# Patient Record
Sex: Female | Born: 1995 | Hispanic: Yes | Marital: Single | Smoking: Never smoker
Health system: Southern US, Community
[De-identification: ages and names within clinical notes are randomized; demographics above are authoritative.]

## PROBLEM LIST (undated history)

## (undated) DIAGNOSIS — Z8619 Personal history of other infectious and parasitic diseases: Secondary | ICD-10-CM

## (undated) HISTORY — DX: Personal history of other infectious and parasitic diseases: Z86.19

---
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
## 2016-06-01 ENCOUNTER — Ambulatory Visit (INDEPENDENT_AMBULATORY_CARE_PROVIDER_SITE_OTHER): Payer: Self-pay | Admitting: Family

## 2016-06-01 ENCOUNTER — Encounter: Payer: Self-pay | Admitting: Family

## 2016-06-01 VITALS — BP 106/66 | HR 95 | Temp 98.1°F | Resp 16 | Ht 61.0 in | Wt 85.8 lb

## 2016-06-01 DIAGNOSIS — R110 Nausea: Secondary | ICD-10-CM

## 2016-06-01 LAB — CBC WITH DIFFERENTIAL/PLATELET
BASOS ABS: 0 10*3/uL (ref 0.0–0.1)
BASOS PCT: 0.5 % (ref 0.0–3.0)
EOS ABS: 0 10*3/uL (ref 0.0–0.7)
Eosinophils Relative: 0.8 % (ref 0.0–5.0)
HCT: 40.6 % (ref 36.0–46.0)
HEMOGLOBIN: 13.7 g/dL (ref 12.0–15.0)
Lymphocytes Relative: 34.6 % (ref 12.0–46.0)
Lymphs Abs: 1.7 10*3/uL (ref 0.7–4.0)
MCHC: 33.8 g/dL (ref 30.0–36.0)
MCV: 86.1 fl (ref 78.0–100.0)
MONO ABS: 0.3 10*3/uL (ref 0.1–1.0)
Monocytes Relative: 6.8 % (ref 3.0–12.0)
NEUTROS PCT: 57.3 % (ref 43.0–77.0)
Neutro Abs: 2.9 10*3/uL (ref 1.4–7.7)
Platelets: 208 10*3/uL (ref 150.0–400.0)
RBC: 4.71 Mil/uL (ref 3.87–5.11)
RDW: 13.3 % (ref 11.5–14.6)
WBC: 5 10*3/uL (ref 4.5–10.5)

## 2016-06-01 LAB — COMPREHENSIVE METABOLIC PANEL
ALBUMIN: 4.6 g/dL (ref 3.5–5.2)
ALT: 9 U/L (ref 0–35)
AST: 14 U/L (ref 0–37)
Alkaline Phosphatase: 42 U/L (ref 39–117)
BILIRUBIN TOTAL: 0.3 mg/dL (ref 0.2–1.2)
BUN: 12 mg/dL (ref 6–23)
CHLORIDE: 105 meq/L (ref 96–112)
CO2: 27 mEq/L (ref 19–32)
CREATININE: 0.54 mg/dL (ref 0.40–1.20)
Calcium: 9.6 mg/dL (ref 8.4–10.5)
GFR: 152.71 mL/min (ref >60.00)
Glucose, Bld: 90 mg/dL (ref 70–99)
Potassium: 3.7 mEq/L (ref 3.5–5.1)
SODIUM: 138 meq/L (ref 135–145)
TOTAL PROTEIN: 7.1 g/dL (ref 6.0–8.3)

## 2016-06-01 LAB — TSH: TSH: 1.45 u[IU]/mL (ref 0.35–5.50)

## 2016-06-01 LAB — PREGNANCY, URINE: PREG TEST UR: NEGATIVE

## 2016-06-01 MED ORDER — OMEPRAZOLE 40 MG PO CPDR: 40.0000 mg | DELAYED_RELEASE_CAPSULE | Freq: Every day | ORAL | 3 refills | Status: DC

## 2016-06-01 MED ORDER — ONDANSETRON 4 MG PO TBDP: 4.0000 mg | ORAL_TABLET | Freq: Three times a day (TID) | ORAL | 0 refills | Status: DC | PRN

## 2016-06-01 MED FILL — OMEPRAZOLE DR 40 MG CAPSULE: 40 | 30 days supply | Qty: 30 | Fill #0

## 2016-06-01 MED FILL — ONDANSETRON ODT 4 MG TABLET: 4 | 10 days supply | Qty: 30 | Fill #0

## 2016-06-01 NOTE — Progress Notes (Signed)
Pre visit review using our clinic review tool, if applicable. No additional management support is needed unless otherwise documented below in the visit note. 

## 2016-06-01 NOTE — Addendum Note (Signed)
Addended by: Eustace QuailEABOLD, Cystal Shannahan J on: 06/01/2016 11:59 AM   Modules accepted: Orders

## 2016-06-01 NOTE — Addendum Note (Signed)
Addended by: Eustace QuailEABOLD, Prakash Kimberling J on: 06/01/2016 10:52 AM   Modules accepted: Orders

## 2016-06-01 NOTE — Progress Notes (Signed)
Subjective:    Patient ID: Loretta Gomez, female    DOB: [birth year], 21 y.o.   MRN: [id number]  HPI   Ms. Gomez is a 21 yr old female who presents today to establish care. Pt presents with chief complaint of nausea na weight loss  Reports that this started mild- summer 2017, symptoms worsened after she had nexplanon placed 10/17 by the health department. They also prescribed her megace and reglan which she is not currently using. Reglan did not help her nausea.  LMP spotting one day in march.  She reports tolerating liquids, but even ensure will sometimes even cause gagging.  She is able to tolerate solids. Yesterday she ate: Donuts, fish, pasta and a sandwich yesterday.  Reports that her weight 1 year ago was 92 pounds, 100 pounds is her baseline weight.   She denies associated abdominal pain.  Began Nexplanon 12/16/15.  This was prescribed by the health department.     Review of Systems  Constitutional: Negative for unexpected weight change.  HENT: Negative for hearing loss and rhinorrhea.   Eyes: Negative for visual disturbance.  Respiratory: Negative for cough.   Gastrointestinal: Negative for blood in stool, constipation and diarrhea.  Genitourinary: Negative for dysuria and frequency.  Musculoskeletal: Negative for arthralgias and myalgias.  Neurological:       Occasional stress headaches  Hematological: Negative for adenopathy.  Psychiatric/Behavioral:       Does have some anxiety symptoms.  This occurs when she is around people/crowds Denies depression.     History reviewed. No pertinent past medical history.   Social History   Social History  . Marital status: Single    Spouse name: N/A  . Number of children: N/A  . Years of education: N/A   Occupational History  . Not on file.   Social History Main Topics  . Smoking status: Current Every Day Smoker    Types: E-cigarettes  . Smokeless tobacco: Never Used  . Alcohol use Yes  . Drug use: Yes     Types: Marijuana  . Sexual activity: Not Currently    Birth control/ protection: Implant   Other Topics Concern  . Not on file   Social History Narrative  . No narrative on file    Past Surgical History:  Procedure Laterality Date  . HERNIA REPAIR N/A 2010    Family History  Problem Relation Age of Onset  . Mental illness Maternal Grandmother   . Cancer Maternal Grandfather   . Heart disease Maternal Grandfather     Allergies not on file  No current outpatient prescriptions on file prior to visit.   No current facility-administered medications on file prior to visit.     BP 106/66 (BP Location: Right Arm, Patient Position: Sitting, Cuff Size: Normal)   Pulse 95   Temp 98.1 F (36.7 C) (Oral)   Resp 16   Ht 5\' 1"  (1.549 m)   Wt 85 lb 12.8 oz (38.9 kg)   SpO2 100%   BMI 16.21 kg/m       Objective:   Physical Exam  Constitutional: She is oriented to person, place, and time. No distress.  cachectic  HENT:  Head: Normocephalic and atraumatic.  Right Ear: Tympanic membrane and ear canal normal.  Left Ear: Tympanic membrane and ear canal normal.  Mouth/Throat: No oropharyngeal exudate, posterior oropharyngeal edema or posterior oropharyngeal erythema.  Neck: Neck supple. No thyromegaly present.  Cardiovascular: Normal rate and regular rhythm.   No murmur heard. Pulmonary/Chest: Effort  normal and breath sounds normal. No respiratory distress. She has no wheezes. She has no rales. She exhibits no tenderness.  Abdominal: Soft. Bowel sounds are normal. She exhibits no distension. There is no tenderness. There is no rebound and no guarding.  Musculoskeletal: She exhibits no edema.  Lymphadenopathy:    She has no cervical adenopathy.  Neurological: She is alert and oriented to person, place, and time.  Skin: Skin is warm and dry.  Psychiatric: She has a normal mood and affect. Her behavior is normal. Judgment and thought content normal.          Assessment  & Plan:  Chronic nausea/weight loss- etiology includes side effect from nexplanon, gastritis/ulcer, H pylori, abnormal thyroid funciton Advised pt as follows:  Please begin omeprazole (antacid medicine daily). Begin zofran as needed for nausea. Complete lab work prior to leaving. You will be contacted about scheduling your abdominal ultrasound to evaluate your gallbladder. Add ensure 1 can three times daily after meals. Stop megace and reglan. Go to ER if you are unable to keep down food/liquids.

## 2016-06-01 NOTE — Patient Instructions (Addendum)
Please begin omeprazole (antacid medicine daily). Begin zofran as needed for nausea. Complete lab work prior to leaving. You will be contacted about scheduling your abdominal ultrasound to evaluate your gallbladder. Add ensure 1 can three times daily after meals. Stop megace and reglan. Go to ER if you are unable to keep down food/liquids.

## 2016-06-02 LAB — H. PYLORI BREATH TEST: H. pylori Breath Test: NOT DETECTED

## 2016-06-07 ENCOUNTER — Telehealth: Payer: Self-pay

## 2016-06-07 NOTE — Telephone Encounter (Signed)
Tried to reach pt left a message for pt to call back.

## 2016-06-07 NOTE — Telephone Encounter (Signed)
-----   Message from Sandford Craze, NP sent at 06/04/2016  4:25 PM EDT ----- Blood work is normal. Negative h pylori, blood count, liver function, kidney function, pregnancy. Will see what we learn from the ultrasound.

## 2016-06-09 ENCOUNTER — Ambulatory Visit (HOSPITAL_BASED_OUTPATIENT_CLINIC_OR_DEPARTMENT_OTHER): Payer: Self-pay

## 2018-04-27 ENCOUNTER — Other Ambulatory Visit: Payer: Self-pay

## 2018-04-27 ENCOUNTER — Emergency Department (HOSPITAL_BASED_OUTPATIENT_CLINIC_OR_DEPARTMENT_OTHER)
Admission: EM | Admit: 2018-04-27 | Discharge: 2018-04-27 | Disposition: A | Discharge status: Home / Self Care | Payer: Self-pay | Attending: Emergency Medicine | Admitting: Emergency Medicine

## 2018-04-27 ENCOUNTER — Emergency Department (HOSPITAL_BASED_OUTPATIENT_CLINIC_OR_DEPARTMENT_OTHER): Payer: Self-pay

## 2018-04-27 ENCOUNTER — Encounter (HOSPITAL_BASED_OUTPATIENT_CLINIC_OR_DEPARTMENT_OTHER): Payer: Self-pay | Admitting: Emergency Medicine

## 2018-04-27 ENCOUNTER — Encounter: Payer: Self-pay | Admitting: Family

## 2018-04-27 DIAGNOSIS — F17290 Nicotine dependence, other tobacco product, uncomplicated: Secondary | ICD-10-CM | POA: Insufficient documentation

## 2018-04-27 DIAGNOSIS — K529 Noninfective gastroenteritis and colitis, unspecified: Secondary | ICD-10-CM | POA: Insufficient documentation

## 2018-04-27 DIAGNOSIS — R197 Diarrhea, unspecified: Secondary | ICD-10-CM

## 2018-04-27 LAB — COMPREHENSIVE METABOLIC PANEL
ALBUMIN: 4.9 g/dL (ref 3.5–5.0)
ALK PHOS: 39 U/L (ref 38–126)
ALT: 12 U/L (ref 0–44)
AST: 20 U/L (ref 15–41)
Anion gap: 9 (ref 5–15)
BUN: 12 mg/dL (ref 6–20)
CO2: 21 mmol/L — AB (ref 22–32)
CREATININE: 0.48 mg/dL (ref 0.44–1.00)
Calcium: 9.4 mg/dL (ref 8.9–10.3)
Chloride: 107 mmol/L (ref 98–111)
GFR calc Af Amer: >60 mL/min (ref >60)
GFR calc non Af Amer: >60 mL/min (ref >60)
GLUCOSE: 100 mg/dL — AB (ref 70–99)
Potassium: 3.4 mmol/L — ABNORMAL LOW (ref 3.5–5.1)
SODIUM: 137 mmol/L (ref 135–145)
Total Bilirubin: 0.6 mg/dL (ref 0.3–1.2)
Total Protein: 7.8 g/dL (ref 6.5–8.1)

## 2018-04-27 LAB — CBC WITH DIFFERENTIAL/PLATELET
Abs Immature Granulocytes: 0.02 10*3/uL (ref 0.00–0.07)
Basophils Absolute: 0 10*3/uL (ref 0.0–0.1)
Basophils Relative: 0 %
EOS PCT: 0 %
Eosinophils Absolute: 0 10*3/uL (ref 0.0–0.5)
HEMATOCRIT: 40.7 % (ref 36.0–46.0)
HEMOGLOBIN: 13.1 g/dL (ref 12.0–15.0)
IMMATURE GRANULOCYTES: 0 %
LYMPHS ABS: 1.7 10*3/uL (ref 0.7–4.0)
LYMPHS PCT: 15 %
MCH: 28.1 pg (ref 26.0–34.0)
MCHC: 32.2 g/dL (ref 30.0–36.0)
MCV: 87.3 fL (ref 80.0–100.0)
MONOS PCT: 5 %
Monocytes Absolute: 0.6 10*3/uL (ref 0.1–1.0)
Neutro Abs: 9.6 10*3/uL — ABNORMAL HIGH (ref 1.7–7.7)
Neutrophils Relative %: 80 %
Platelets: 212 10*3/uL (ref 150–400)
RBC: 4.66 MIL/uL (ref 3.87–5.11)
RDW: 12.2 % (ref 11.5–15.5)
WBC: 11.9 10*3/uL — ABNORMAL HIGH (ref 4.0–10.5)
nRBC: 0 % (ref 0.0–0.2)

## 2018-04-27 LAB — URINALYSIS, ROUTINE W REFLEX MICROSCOPIC
BILIRUBIN URINE: NEGATIVE
Glucose, UA: NEGATIVE mg/dL
Ketones, ur: 15 mg/dL — AB
Leukocytes,Ua: NEGATIVE
NITRITE: NEGATIVE
PH: 6 (ref 5.0–8.0)
Protein, ur: NEGATIVE mg/dL

## 2018-04-27 LAB — PREGNANCY, URINE: Preg Test, Ur: NEGATIVE

## 2018-04-27 LAB — URINALYSIS, MICROSCOPIC (REFLEX)

## 2018-04-27 LAB — OCCULT BLOOD X 1 CARD TO LAB, STOOL: Fecal Occult Bld: POSITIVE — AB

## 2018-04-27 LAB — LIPASE, BLOOD: Lipase: 34 U/L (ref 11–51)

## 2018-04-27 MED ORDER — CIPROFLOXACIN HCL 500 MG PO TABS
500.0000 mg | ORAL_TABLET | Freq: Two times a day (BID) | ORAL | 0 refills | Status: AC
Start: 2018-04-27 — End: 2018-05-04

## 2018-04-27 MED ORDER — SODIUM CHLORIDE 0.9 % IV BOLUS
500.0000 mL | Freq: Once | INTRAVENOUS | Status: AC
  Administered 2018-04-27: 19:00:00 via INTRAVENOUS

## 2018-04-27 MED ORDER — METRONIDAZOLE 500 MG PO TABS: 500.0000 mg | ORAL_TABLET | Freq: Two times a day (BID) | ORAL | 0 refills | Status: AC

## 2018-04-27 MED ORDER — IOPAMIDOL (ISOVUE-300) INJECTION 61%
100.0000 mL | Freq: Once | INTRAVENOUS | Status: AC | PRN
  Administered 2018-04-27: 100 mL via INTRAVENOUS

## 2018-04-27 NOTE — Discharge Instructions (Signed)
You were seen in the ED today with bloody diarrhea. We see inflammation in the colon on CT. This may respond to antibiotics but you will need to call the Gastroenterologist tomorrow and schedule a follow up appointment in the next 2 weeks. Return to the ED with any fever, chills, or other concerning symptoms. If you begin to have pain in your joints, arms/legs, or rash you should stop the antibiotics and seek medical attention. Do not drink alcohol while taking this medication.

## 2018-04-27 NOTE — ED Provider Notes (Signed)
Emergency Department Provider Note   I have reviewed the triage vital signs and the nursing notes.   HISTORY  Chief Complaint Blood In Stools   HPI Loretta Gomez is a 23 y.o. female with PMH of inguinal hernia presents to the emergency department for evaluation of multiple episodes of bloody diarrhea today.  Symptoms have not occurred in the past.  Patient reports bright red blood mixed with loose bowel movements.  She has associated lower abdominal cramping pain.  No recent travel.  No back pain.  No UTI symptoms.  No vaginal bleeding or discharge.  Her last menstrual cycle was in September but was recently taken off of the Depo shot.  She is not experiencing any chest pain or shortness of breath.  No anticoagulation.  Patient felt lightheaded during bowel movements but is not feeling fatigued or lightheaded when not having bowel movements  Past Medical History:  Diagnosis Date  . History of chicken pox     Patient Active Problem List   Diagnosis Date Noted  . BLURRED VISION 03/10/2010  . PANIC DISORDER 11/05/2008  . DIAPHORESIS 11/05/2008  . HYPOKALEMIA, HX OF 11/05/2008  . INGUINAL HERNIA, RIGHT 04/19/2008    Past Surgical History:  Procedure Laterality Date  . HERNIA REPAIR N/A 2010   right inguinal    Allergies Patient has no known allergies.  Family History  Problem Relation Age of Onset  . Mental illness Maternal Grandmother   . Cancer Maternal Grandfather   . Heart disease Maternal Grandfather     Social History Social History   Tobacco Use  . Smoking status: Current Every Day Smoker    Types: E-cigarettes  . Smokeless tobacco: Never Used  . Tobacco comment: once a month  Substance Use Topics  . Alcohol use: Yes  . Drug use: Yes    Types: Marijuana    Comment: 2-3 times a month    Review of Systems  Constitutional: No fever/chills Eyes: No visual changes. ENT: No sore throat. Cardiovascular: Denies chest pain. Respiratory: Denies  shortness of breath. Gastrointestinal: Positive lower abdominal pain. Positive nausea, no vomiting.  Positive bloody diarrhea.  No constipation. Genitourinary: Negative for dysuria. Musculoskeletal: Negative for back pain. Skin: Negative for rash. Neurological: Negative for headaches, focal weakness or numbness.  10-point ROS otherwise negative.  ____________________________________________   PHYSICAL EXAM:  VITAL SIGNS: ED Triage Vitals  Enc Vitals Group     BP 04/27/18 1825 129/76     Pulse Rate 04/27/18 1825 (!) 111     Resp 04/27/18 1825 20     Temp 04/27/18 1825 97.8 F (36.6 C)     Temp Source 04/27/18 1825 Oral     SpO2 04/27/18 1825 100 %     Weight 04/27/18 1825 88 lb (39.9 kg)     Height 04/27/18 1825 5\' 4"  (1.626 m)   Constitutional: Alert and oriented. Well appearing and in no acute distress. Eyes: Conjunctivae are normal. Head: Atraumatic. Nose: No congestion/rhinnorhea. Mouth/Throat: Mucous membranes are moist.  Neck: No stridor.   Cardiovascular: Normal rate, regular rhythm. Good peripheral circulation. Grossly normal heart sounds.   Respiratory: Normal respiratory effort.  No retractions. Lungs CTAB. Gastrointestinal: Soft with mild lower abdominal tenderness. No distention. Rectal exam performed with verbal consent and nurse chaperone. No gross blood or melena. No hemorrhoids or fissures.  Musculoskeletal: No lower extremity tenderness nor edema. No gross deformities of extremities. Neurologic:  Normal speech and language. No gross focal neurologic deficits are appreciated.  Skin:  Skin is warm, dry and intact. No rash noted.  ____________________________________________   LABS (all labs ordered are listed, but only abnormal results are displayed)  Labs Reviewed  COMPREHENSIVE METABOLIC PANEL - Abnormal; Notable for the following components:      Result Value   Potassium 3.4 (*)    CO2 21 (*)    Glucose, Bld 100 (*)    All other components within  normal limits  CBC WITH DIFFERENTIAL/PLATELET - Abnormal; Notable for the following components:   WBC 11.9 (*)    Neutro Abs 9.6 (*)    All other components within normal limits  URINALYSIS, ROUTINE W REFLEX MICROSCOPIC - Abnormal; Notable for the following components:   Color, Urine STRAW (*)    Specific Gravity, Urine <1.005 (*)    Hgb urine dipstick TRACE (*)    Ketones, ur 15 (*)    All other components within normal limits  OCCULT BLOOD X 1 CARD TO LAB, STOOL - Abnormal; Notable for the following components:   Fecal Occult Bld POSITIVE (*)    All other components within normal limits  URINALYSIS, MICROSCOPIC (REFLEX) - Abnormal; Notable for the following components:   Bacteria, UA RARE (*)    All other components within normal limits  LIPASE, BLOOD  PREGNANCY, URINE   ____________________________________________  RADIOLOGY  Ct Abdomen Pelvis W Contrast  Result Date: 04/27/2018 CLINICAL DATA:  23 y/o female with onset of bloody stool along with diarrhea, weakness, sweating, nausea today. No prior occurrence. History of hernia repair in the sixth grade. EXAM: CT ABDOMEN AND PELVIS WITH CONTRAST TECHNIQUE: Multidetector CT imaging of the abdomen and pelvis was performed using the standard protocol following bolus administration of intravenous contrast. CONTRAST:  ISOVUE-300 IOPAMIDOL (ISOVUE-300) INJECTION 61% COMPARISON:  None. FINDINGS: Lower chest: No acute abnormality. Hepatobiliary: No focal liver abnormality is seen. No radiopaque gallstones, biliary dilatation, or pericholecystic inflammatory changes. Pancreas: Unremarkable. No pancreatic ductal dilatation or surrounding inflammatory changes. Spleen: Normal in size without focal abnormality. Adrenals/Urinary Tract: Kidneys and ureters are unremarkable. The bladder and visualized portion of the urethra are normal. Stomach/Bowel: The stomach and small bowel loops are normal in appearance. Appendix is not seen. The colon has a  mildly thickened wall beginning at the level of the splenic flexure and extending through the descending colon to the sigmoid. There is associated mild mucosal enhancement. No surrounding inflammatory changes or abscess. Vascular/Lymphatic: No significant vascular findings are present. No enlarged abdominal or pelvic lymph nodes. Reproductive: The uterus is present. No adnexal mass. Other: No abdominal wall hernia or abnormality. No abdominopelvic ascites. Musculoskeletal: No acute or significant osseous findings. IMPRESSION: 1. Mild colonic wall thickening beginning at the level of the splenic flexure and extending through the descending colon to the sigmoid. Findings are consistent with colitis. Considerations include infectious, inflammatory, or ischemic causes. 2. No abscess or perforation. Electronically Signed   By: Norva Pavlov M.D.   On: 04/27/2018 20:11    ____________________________________________   PROCEDURES  Procedure(s) performed:   Procedures  None ____________________________________________   INITIAL IMPRESSION / ASSESSMENT AND PLAN / ED COURSE  Pertinent labs & imaging results that were available during my care of the patient were reviewed by me and considered in my medical decision making (see chart for details).  Patient presents to the emergency department for evaluation of bloody diarrhea without fever.  Mild lower abdominal tenderness.  Patient with tachycardia on arrival but afebrile.  Rectal exam with no gross blood or melena.  Plan  for screening labs, CT abdomen pelvis, and reassess after fluids.   Labs without significant anemia.  No concern for sepsis or ischemic colitis.  CT reviewed which shows colitis.  Plan to treat with antibiotics but advised that the patient will require very close follow-up with gastroenterology for short interval lower endoscopy to evaluate for possible inflammatory colitis.  Patient verbalizes understanding of this plan along with  return precautions.  ____________________________________________  FINAL CLINICAL IMPRESSION(S) / ED DIAGNOSES  Final diagnoses:  Colitis  Bloody diarrhea    MEDICATIONS GIVEN DURING THIS VISIT:  Medications  sodium chloride 0.9 % bolus 500 mL ( Intravenous Stopped 04/27/18 1935)  iopamidol (ISOVUE-300) 61 % injection 100 mL (100 mLs Intravenous Contrast Given 04/27/18 1949)     NEW OUTPATIENT MEDICATIONS STARTED DURING THIS VISIT:  Discharge Medication List as of 04/27/2018  8:33 PM    START taking these medications   Details  ciprofloxacin (CIPRO) 500 MG tablet Take 1 tablet (500 mg total) by mouth every 12 (twelve) hours for 7 days., Starting Thu 04/27/2018, Until Thu 05/04/2018, Print    metroNIDAZOLE (FLAGYL) 500 MG tablet Take 1 tablet (500 mg total) by mouth 2 (two) times daily for 7 days., Starting Thu 04/27/2018, Until Thu 05/04/2018, Print        Note:  This document was prepared using Dragon voice recognition software and may include unintentional dictation errors.  Alona BeneJoshua , MD Emergency Medicine    , Arlyss RepressJoshua G, MD 04/28/18 [id number]

## 2018-04-27 NOTE — ED Triage Notes (Signed)
Reports blood in stools which began today.  Describes this as bright red x 8 episodes.  Denies dizziness but c/o weakness.

## 2018-08-23 ENCOUNTER — Ambulatory Visit (INDEPENDENT_AMBULATORY_CARE_PROVIDER_SITE_OTHER)
Admission: RE | Admit: 2018-08-23 | Discharge: 2018-08-23 | Disposition: A | Discharge status: Home / Self Care | Payer: Self-pay | Source: Ambulatory Visit

## 2018-08-23 ENCOUNTER — Telehealth: Payer: Self-pay | Admitting: Family

## 2018-08-23 ENCOUNTER — Encounter: Payer: Self-pay | Admitting: Family

## 2018-08-23 ENCOUNTER — Inpatient Hospital Stay: Admit: 2018-08-23 | Discharge: 2018-08-23 | Disposition: A | Discharge status: Home / Self Care | Payer: Self-pay

## 2018-08-23 DIAGNOSIS — N898 Other specified noninflammatory disorders of vagina: Secondary | ICD-10-CM

## 2018-08-23 DIAGNOSIS — Z20822 Contact with and (suspected) exposure to covid-19: Secondary | ICD-10-CM

## 2018-08-23 DIAGNOSIS — N926 Irregular menstruation, unspecified: Secondary | ICD-10-CM

## 2018-08-23 MED ORDER — ALBUTEROL SULFATE HFA 108 (90 BASE) MCG/ACT IN AERS: 2.0000 | INHALATION_SPRAY | Freq: Four times a day (QID) | RESPIRATORY_TRACT | 0 refills | Status: DC | PRN

## 2018-08-23 MED ORDER — BENZONATATE 100 MG PO CAPS: 100.0000 mg | ORAL_CAPSULE | Freq: Three times a day (TID) | ORAL | 0 refills | Status: DC | PRN

## 2018-08-23 MED ORDER — METRONIDAZOLE 500 MG PO TABS: 500.0000 mg | ORAL_TABLET | Freq: Two times a day (BID) | ORAL | 0 refills | Status: DC

## 2018-08-23 NOTE — Discharge Instructions (Addendum)
As we talked about there are different things that can cause irregular vaginal bleeding.  Being that you were on the birth control and came off your body may take time to regulate and hormones may change. You may continue to have some irregular menstrual cycles for the next couple months before that this regulates.  Another thing that can cause irregular bleeding is infection.  Based on your symptoms we will go ahead and treat you for a bacterial infection to see if this helps Otherwise if your symptoms continue you need to be seen and evaluated in person I put a contact on your discharge instructions for the [facility] [place] in the health department Follow up as needed for continued or worsening symptoms

## 2018-08-23 NOTE — Progress Notes (Signed)
E-Visit for Corona Virus Screening   Your current symptoms could be consistent with the coronavirus.  Call your health care provider or local health department to request and arrange formal testing. Many health care providers can now test patients at their office but not all are.  Please quarantine yourself while awaiting your test results.  [facility] [phone], [facility] [phone], [facility] [phone] or visit [email or web address]  and You have been enrolled in MyChart Home Monitoring for COVID-19.  Daily you will receive a questionnaire within the MyChart website. Our COVID-19 response team will be monitoring your responses daily.  Approximately 5 minutes was spent documenting and reviewing patient's chart.    COVID-19 is a respiratory illness with symptoms that are similar to the flu. Symptoms are typically mild to moderate, but there have been cases of severe illness and death due to the virus. The following symptoms may appear 2-14 days after exposure: . Fever . Cough . Shortness of breath or difficulty breathing . Chills . Repeated shaking with chills . Muscle pain . Headache . Sore throat . New loss of taste or smell . Fatigue . Congestion or runny nose . Nausea or vomiting . Diarrhea  It is vitally important that if you feel that you have an infection such as this virus or any other virus that you stay home and away from places where you may spread it to others.  You should self-quarantine for 14 days if you have symptoms that could potentially be coronavirus or have been in close contact a with a person diagnosed with COVID-19 within the last 2 weeks. You should avoid contact with people age 65 and older.   You should wear a mask or cloth face covering over your nose and mouth if you must be around other people or animals, including pets (even at  home). Try to stay at least 6 feet away from other people. This will protect the people around you.  You can use medication such as A prescription cough medication called Tessalon Perles 100 mg. You may take 1-2 capsules every 8 hours as needed for cough and A prescription inhaler called Albuterol MDI 90 mcg /actuation 2 puffs every 4 hours as needed for shortness of breath, wheezing, cough.  You may also take acetaminophen (Tylenol) as needed for fever.   Reduce your risk of any infection by using the same precautions used for avoiding the common cold or flu:  . Wash your hands often with soap and warm water for at least 20 seconds.  If soap and water are not readily available, use an alcohol-based hand sanitizer with at least 60% alcohol.  . If coughing or sneezing, cover your mouth and nose by coughing or sneezing into the elbow areas of your shirt or coat, into a tissue or into your sleeve (not your hands). . Avoid shaking hands with others and consider head nods or verbal greetings only. . Avoid touching your eyes, nose, or mouth with unwashed hands.  . Avoid close contact with people who are sick. . Avoid places or events with large numbers of people in one location, like concerts or sporting events. . Carefully consider travel plans you have or are making. . If you are planning any travel outside or inside the [place], visit the CDC's Travelers' Health webpage for the latest health notices. . If you have some symptoms but not all symptoms, continue to monitor at home and seek medical attention if your symptoms   worsen. . If you are having a medical emergency, call 911.  HOME CARE . Only take medications as instructed by your medical team. . Drink plenty of fluids and get plenty of rest. . A steam or ultrasonic humidifier can help if you have congestion.   GET HELP RIGHT AWAY IF YOU HAVE EMERGENCY WARNING SIGNS** FOR COVID-19. If you or someone is showing any of these signs seek emergency  medical care immediately. Call 911 or proceed to your closest emergency facility if: . You develop worsening high fever. . Trouble breathing . Bluish lips or face . Persistent pain or pressure in the chest . New confusion . Inability to wake or stay awake . You cough up blood. . Your symptoms become more severe  **This list is not all possible symptoms. Contact your medical provider for any symptoms that are sever or concerning to you.   MAKE SURE YOU   Understand these instructions.  Will watch your condition.  Will get help right away if you are not doing well or get worse.  Your e-visit answers were reviewed by a board certified advanced clinical practitioner to complete your personal care plan.  Depending on the condition, your plan could have included both over the counter or prescription medications.  If there is a problem please reply once you have received a response from your provider.  Your safety is important to us.  If you have drug allergies check your prescription carefully.    You can use MyChart to ask questions about today's visit, request a non-urgent call back, or ask for a work or school excuse for 24 hours related to this e-Visit. If it has been greater than 24 hours you will need to follow up with your provider, or enter a new e-Visit to address those concerns. You will get an e-mail in the next two days asking about your experience.  I hope that your e-visit has been valuable and will speed your recovery. Thank you for using e-visits.    

## 2018-08-23 NOTE — ED Provider Notes (Signed)
Virtual Visit via Video Note:  Loretta Gomez  initiated request for Telemedicine visit with [facility] team. I connected with Loretta Gomez  on 08/23/2018 at 1:26 PM  for a synchronized telemedicine visit using a video enabled HIPPA compliant telemedicine application. I verified that I am speaking with Loretta Gomez  using two identifiers. Orvan July, NP  was physically located in a [facility] site and Loretta Gomez was located at a different location.   The limitations of evaluation and management by telemedicine as well as the availability of in-person appointments were discussed. Patient was informed that she  may incur a bill ( including co-pay) for this virtual visit encounter. Loretta Gomez  expressed understanding and gave verbal consent to proceed with virtual visit.     History of Present Illness:Loretta Gomez  is a 23 y.o. female presents with irregular vaginal bleeding.  Reports that she had a Depo shot back in October of last year.  She was supposed to receive her second 1 in January of this year.  She never received this shot.  She did not have a menstrual cycle until May 13.  This lasted 5 days and was light to medium bleeding.  She then had another menstrual cycle on 27 May through 3 May.  This bleeding was spotting.  She is also had some associated vaginal discharge that is clear with mild odor.  Mild lower abdominal cramping.  She is not currently sexually active and reports no concern for pregnancy or STDs. No fever, dysuria, hematuria.  Denies any dizziness, lightheadedness or trouble breathing.  ROS per HPI   Past Medical History:  Diagnosis Date  . History of chicken pox     No Known Allergies      Observations/Objective:GENERAL APPEARANCE: Well developed, well nourished, alert and cooperative, and appears to be in no acute distress. HEAD: normocephalic. Non labored breathing, no  dyspnea or distress Skin: Skin normal color  PSYCHIATRIC: The mental examination revealed the patient was oriented to person, place, and time. The patient was able to demonstrate good judgement and reason, without hallucinations, abnormal affect or abnormal behaviors during the examination. Patient is not suicidal     Assessment and Plan: irregular vaginal bleeding. This is most likely coming from hormonal imbalance or infection. The bleeding is not heavy and there is no concern for anemia. She is also having vaginal discharge and odor. If there is some sort of bacterial infection this could cause spotting or irregular bleeding. I think it would be appropriate to treat for BV and have her monitor. Contact given for Women's health for follow up if needed.    Follow Up Instructions:Follow up with women's health as needed.     I discussed the assessment and treatment plan with the patient. The patient was provided an opportunity to ask questions and all were answered. The patient agreed with the plan and demonstrated an understanding of the instructions.   The patient was advised to call back or seek an in-person evaluation if the symptoms worsen or if the condition fails to improve as anticipated.      Orvan July, NP  08/23/2018 1:26 PM         Orvan July, NP 08/23/18 [id number]

## 2018-08-28 ENCOUNTER — Ambulatory Visit (INDEPENDENT_AMBULATORY_CARE_PROVIDER_SITE_OTHER)
Admission: RE | Admit: 2018-08-28 | Discharge: 2018-08-28 | Disposition: A | Discharge status: Home / Self Care | Payer: Self-pay | Source: Ambulatory Visit

## 2018-08-28 ENCOUNTER — Encounter: Payer: Self-pay | Admitting: Family

## 2018-08-28 ENCOUNTER — Ambulatory Visit (INDEPENDENT_AMBULATORY_CARE_PROVIDER_SITE_OTHER): Payer: Self-pay | Admitting: Family

## 2018-08-28 DIAGNOSIS — N926 Irregular menstruation, unspecified: Secondary | ICD-10-CM

## 2018-08-28 DIAGNOSIS — N898 Other specified noninflammatory disorders of vagina: Secondary | ICD-10-CM

## 2018-08-28 DIAGNOSIS — F329 Major depressive disorder, single episode, unspecified: Secondary | ICD-10-CM

## 2018-08-28 DIAGNOSIS — F32A Depression, unspecified: Secondary | ICD-10-CM

## 2018-08-28 DIAGNOSIS — K5900 Constipation, unspecified: Secondary | ICD-10-CM

## 2018-08-28 DIAGNOSIS — F419 Anxiety disorder, unspecified: Secondary | ICD-10-CM

## 2018-08-28 DIAGNOSIS — F429 Obsessive-compulsive disorder, unspecified: Secondary | ICD-10-CM

## 2018-08-28 MED ORDER — FLUCONAZOLE 150 MG PO TABS: 150.0000 mg | ORAL_TABLET | Freq: Every day | ORAL | 0 refills | Status: DC

## 2018-08-28 MED ORDER — POLYETHYLENE GLYCOL 3350 17 GM/SCOOP PO POWD: 1.0000 | Freq: Once | ORAL | 0 refills | Status: AC

## 2018-08-28 MED ORDER — METRONIDAZOLE 0.75 % VA GEL: 1.0000 | Freq: Every day | VAGINAL | 0 refills | Status: DC

## 2018-08-28 NOTE — Progress Notes (Signed)
Virtual Visit via Video Note  I connected with Luetta Orpinel-Cereceres on 08/28/18 at  9:20 AM EDT by a video enabled telemedicine application and verified that I am speaking with the correct person using two identifiers.  Location: Patient: home Provider: home   I discussed the limitations of evaluation and management by telemedicine and the availability of in person appointments. The patient expressed understanding and agreed to proceed.  History of Present Illness:  Patient is a 23 yr old female who presents today for follow up.  She was seen today for an E-visit  with chief complaint of vaginal discharge.   She was treated with metrogel and diflucan.  Reports some irregular bleeding.  Had depo 11/19.    She reports that she was having discharge with her period. Last intercourse 10/19.   She is requesting referral to gyn.   Reports that she has been seeing a therapist. Reports that her anxiety and OCD has been "way out of hand."  Reports that she has thoughts of doing things "over and over again." Feels like something bad will happen to her if she does not do things a certain way.  Often will find herself redoing things. Reports that she has been resistant to medication in the past but is willing to try it since her anxiety/ocd have been so uncontrolled.    She reports some concern about depression. Reports that she has some good days and other days she has no motivation to do anything. Feels "Really really good" on her good days.   She denies si/hi or visual /auditory hallucinations.  She reports that she has been shopping a lot for the last 2 weeks. Has been spending outside of her means.  Reports that she has vivid dreams.  Reports very little sleep recently  Reports that her maternal aunt has bipolar disorder.    Observations/Objective:   Gen: Awake, alert, no acute distress Resp: Breathing is even and non-labored Psych: calm/pleasant demeanor Neuro: Alert and Oriented x  3, + facial symmetry, speech is clear.   Assessment and Plan:  Anxiety/depression/OCD- I am concerned about the possibility of Bipolar disorder. She is currently uninsured. I gave her the number for [facility] to schedule an office visit.  She is advised to go to the ER if she develops thoughts of hurting self/others and she verbalizes understanding.  Irregular menses- refer to GYN.   Follow Up Instructions:    I discussed the assessment and treatment plan with the patient. The patient was provided an opportunity to ask questions and all were answered. The patient agreed with the plan and demonstrated an understanding of the instructions.   The patient was advised to call back or seek an in-person evaluation if the symptoms worsen or if the condition fails to improve as anticipated.   Nance Pear, NP

## 2018-08-28 NOTE — ED Provider Notes (Deleted)
Virtual Visit via Video Note:  Loretta Gomez  initiated request for Telemedicine visit with [facility] team. I connected with Loretta Gomez  on 08/28/2018 at 8:42 AM  for a synchronized telemedicine visit using a video enabled HIPPA compliant telemedicine application. I verified that I am speaking with Loretta Gomez  using two identifiers. Orvan July, NP  was physically located in a [place] [facility] site and Loretta Gomez was located at a different location.   The limitations of evaluation and management by telemedicine as well as the availability of in-person appointments were discussed. Patient was informed that she  may incur a bill ( including co-pay) for this virtual visit encounter. Loretta Gomez  expressed understanding and gave verbal consent to proceed with virtual visit.     History of Present Illness:Loretta Gomez  is a 23 y.o. female presents with 1 day of dysuria, urinary frequency, urgency, retention and hematuria. Symptoms have been constant.  Denies any vaginal discharge, vaginal bleeding or itching.  There is mild vaginal irritation.  She has not take anything for her symptoms.  She is having mild back discomfort but no upper back pain or fevers.  No nausea or vomiting.      Past Medical History:  Diagnosis Date  . History of chicken pox     No Known Allergies      Observations/Objective:GENERAL APPEARANCE: Well developed, well nourished, alert and cooperative, and appears to be in no acute distress. HEAD: normocephalic. Non labored breathing, no dyspnea or distress Skin: Skin normal color  PSYCHIATRIC: The mental examination revealed the patient was oriented to person, place, and time. The patient was able to demonstrate good judgement and reason, without hallucinations, abnormal affect or abnormal behaviors during the examination. Patient is not suicidal     Assessment and Plan:  Symptoms consistent with a urinary tract infection.  Will send in [place] to treat Recommended if symptoms continue or worsen she will need to be seen in person. Also send in Diflucan for yeast infection   Follow Up Instructions: Follow up as needed for continued or worsening symptoms    I discussed the assessment and treatment plan with the patient. The patient was provided an opportunity to ask questions and all were answered. The patient agreed with the plan and demonstrated an understanding of the instructions.   The patient was advised to call back or seek an in-person evaluation if the symptoms worsen or if the condition fails to improve as anticipated.     Orvan July, NP  08/28/2018 8:42 AM         Orvan July, NP 08/28/18 [phone]

## 2018-08-28 NOTE — Discharge Instructions (Signed)
Take the MiraLAX as prescribed for constipation 1 scoop daily.  You can back off if your stools become more soft or runny. We are switching the metronidazole pill to the MetroGel I am also prescribing Diflucan for yeast.  Take 1 pill today and 1 after you finished with the MetroGel treatment If your symptoms continue or worsen you will need to follow-up Follow-up with OB GYN or primary care as needed

## 2018-08-28 NOTE — ED Provider Notes (Signed)
Virtual Visit via Video Note:  Loretta Gomez  initiated request for Telemedicine visit with [facility] team. I connected with Loretta Gomez  on 08/28/2018 at 9:47 AM  for a synchronized telemedicine visit using a video enabled HIPPA compliant telemedicine application. I verified that I am speaking with Loretta Gomez  using two identifiers. Orvan July, NP  was physically located in a [facility] site and Loretta Gomez was located at a different location.   The limitations of evaluation and management by telemedicine as well as the availability of in-person appointments were discussed. Patient was informed that she  may incur a bill ( including co-pay) for this virtual visit encounter. Loretta Gomez  expressed understanding and gave verbal consent to proceed with virtual visit.     History of Present Illness:Loretta Gomez  is a 23 y.o. female presents with multiple complaints.  Reporting she does not want to take the Flagyl that previously was prescribed due to the side effects.  She is requesting a different medication.  She is also requesting medication for a yeast infection.  She still having the vaginal discharge we previously talked about.  She also has been having mucus in her stools.  This is been going on for a long time.  She is having some mild left lower quadrant pressure and constipation issues.  Denies any blood in stools.  Her last BM was yesterday with small hard stools.   Past Medical History:  Diagnosis Date  . History of chicken pox     No Known Allergies      Observations/Objective: GENERAL APPEARANCE: Well developed, well nourished, alert and cooperative, and appears to be in no acute distress. HEAD: normocephalic. Non labored breathing, no dyspnea or distress Skin: Skin normal color  PSYCHIATRIC: The mental examination revealed the patient was oriented to person, place, and time. The  patient was able to demonstrate good judgement and reason, without hallucinations, abnormal affect or abnormal behaviors during the examination. Patient is not suicidal     Assessment and Plan: Changing patient's Flagyl oral to MetroGel to use intravaginally at bedtime. Also prescribing Diflucan for yeast infection Recommended using MiraLAX for constipation to see if this improves her other symptoms For continued or worsening symptoms she will need to be seen in person for evaluation   Follow Up Instructions: Follow up as needed for continued or worsening symptoms     I discussed the assessment and treatment plan with the patient. The patient was provided an opportunity to ask questions and all were answered. The patient agreed with the plan and demonstrated an understanding of the instructions.   The patient was advised to call back or seek an in-person evaluation if the symptoms worsen or if the condition fails to improve as anticipated.     Orvan July, NP  08/28/2018 9:47 AM         Orvan July, NP 08/28/18 [phone]

## 2018-08-28 NOTE — Telephone Encounter (Signed)
Please contact pt and schedule a virtual visit.  Happy to place the referrals but need to see her first to get more information.

## 2018-08-28 NOTE — Telephone Encounter (Signed)
Was seen today .

## 2018-08-30 ENCOUNTER — Encounter: Payer: Self-pay | Admitting: Family

## 2018-08-31 NOTE — Telephone Encounter (Signed)
Spoke with patient. She feels fine today.  Advised her not to take any more difucan.  Come see [place] for face to face visit if recurrent vaginal symptoms.  Pt verbalizes understanding.

## 2018-09-19 ENCOUNTER — Encounter: Payer: Self-pay | Admitting: Family

## 2018-11-07 ENCOUNTER — Encounter (HOSPITAL_COMMUNITY): Payer: Self-pay | Admitting: Emergency Medicine

## 2018-11-07 ENCOUNTER — Emergency Department (HOSPITAL_COMMUNITY)
Admission: EM | Admit: 2018-11-07 | Discharge: 2018-11-08 | Disposition: A | Discharge status: Home / Self Care | Payer: Self-pay | Attending: Emergency Medicine | Admitting: Emergency Medicine

## 2018-11-07 ENCOUNTER — Other Ambulatory Visit: Payer: Self-pay

## 2018-11-07 DIAGNOSIS — F17290 Nicotine dependence, other tobacco product, uncomplicated: Secondary | ICD-10-CM | POA: Insufficient documentation

## 2018-11-07 DIAGNOSIS — R1031 Right lower quadrant pain: Secondary | ICD-10-CM | POA: Insufficient documentation

## 2018-11-07 DIAGNOSIS — Z79899 Other long term (current) drug therapy: Secondary | ICD-10-CM | POA: Insufficient documentation

## 2018-11-07 LAB — COMPREHENSIVE METABOLIC PANEL
ALT: 17 U/L (ref 0–44)
AST: 20 U/L (ref 15–41)
Albumin: 4.5 g/dL (ref 3.5–5.0)
Alkaline Phosphatase: 42 U/L (ref 38–126)
Anion gap: 12 (ref 5–15)
BUN: 11 mg/dL (ref 6–20)
CO2: 20 mmol/L — ABNORMAL LOW (ref 22–32)
Calcium: 9.1 mg/dL (ref 8.9–10.3)
Chloride: 104 mmol/L (ref 98–111)
Creatinine, Ser: 0.55 mg/dL (ref 0.44–1.00)
GFR calc Af Amer: >60 mL/min (ref >60)
GFR calc non Af Amer: >60 mL/min (ref >60)
Glucose, Bld: 102 mg/dL — ABNORMAL HIGH (ref 70–99)
Potassium: 3.6 mmol/L (ref 3.5–5.1)
Sodium: 136 mmol/L (ref 135–145)
Total Bilirubin: 0.6 mg/dL (ref 0.3–1.2)
Total Protein: 7.7 g/dL (ref 6.5–8.1)

## 2018-11-07 LAB — URINALYSIS, ROUTINE W REFLEX MICROSCOPIC
Bilirubin Urine: NEGATIVE
Glucose, UA: NEGATIVE mg/dL
Hgb urine dipstick: NEGATIVE
Ketones, ur: 80 mg/dL — AB
Leukocytes,Ua: NEGATIVE
Nitrite: NEGATIVE
Protein, ur: NEGATIVE mg/dL
Specific Gravity, Urine: 1.02 (ref 1.005–1.030)
pH: 5 (ref 5.0–8.0)

## 2018-11-07 LAB — CBC
HCT: 41.3 % (ref 36.0–46.0)
Hemoglobin: 13.5 g/dL (ref 12.0–15.0)
MCH: 28.9 pg (ref 26.0–34.0)
MCHC: 32.7 g/dL (ref 30.0–36.0)
MCV: 88.4 fL (ref 80.0–100.0)
Platelets: 219 10*3/uL (ref 150–400)
RBC: 4.67 MIL/uL (ref 3.87–5.11)
RDW: 12.5 % (ref 11.5–15.5)
WBC: 9.3 10*3/uL (ref 4.0–10.5)
nRBC: 0 % (ref 0.0–0.2)

## 2018-11-07 LAB — I-STAT BETA HCG BLOOD, ED (MC, WL, AP ONLY): I-stat hCG, quantitative: <5 m[IU]/mL (ref <5)

## 2018-11-07 LAB — LIPASE, BLOOD: Lipase: 26 U/L (ref 11–51)

## 2018-11-07 MED ORDER — SODIUM CHLORIDE 0.9 % IV BOLUS
1000.0000 mL | Freq: Once | INTRAVENOUS | Status: AC
  Administered 2018-11-07: 22:00:00 1000 mL via INTRAVENOUS

## 2018-11-07 MED ORDER — SODIUM CHLORIDE 0.9% FLUSH: 3.0000 mL | Freq: Once | INTRAVENOUS | Status: DC

## 2018-11-07 NOTE — ED Provider Notes (Signed)
[facility] EMERGENCY DEPARTMENT Provider Note   CSN: [id number] Arrival date & time: 11/07/18  1548     History   Chief Complaint Chief Complaint  Patient presents with  . Abdominal Pain    HPI Loretta Gomez is a 23 y.o. female.     HPI  Patient presents with her mother who provides some of the details for the HPI, as well as with impression from her primary care physician who she saw earlier today. Presents with 2 days of abdominal pain, right lower quadrant, intermittent, sharp, with associated change in bowel movements, mucus production in her stool. There is associated nausea, anorexia, but no vomiting. No fever now, no cough. Patient states that she is generally well though she notes that over the past year she has had similar episodes intermittently She has been evaluated at least once with a CT scan in the past years, diagnosed with colitis. She recalls that there has been a conversation about IBS, IBD, Crohn's, but she has had no colonoscopy, nor biopsy.  Past Medical History:  Diagnosis Date  . History of chicken pox     Patient Active Problem List   Diagnosis Date Noted  . BLURRED VISION 03/10/2010  . PANIC DISORDER 11/05/2008  . DIAPHORESIS 11/05/2008  . HYPOKALEMIA, HX OF 11/05/2008  . INGUINAL HERNIA, RIGHT 04/19/2008    Past Surgical History:  Procedure Laterality Date  . HERNIA REPAIR N/A 2010   right inguinal      OB History   No obstetric history on file.      Home Medications    Prior to Admission medications   Medication Sig Start Date End Date Taking? Authorizing Provider  albuterol (VENTOLIN HFA) 108 (90 Base) MCG/ACT inhaler Inhale 2 puffs into the lungs every 6 (six) hours as needed for wheezing or shortness of breath. 08/23/18   Junie Spencer, FNP  benzonatate (TESSALON PERLES) 100 MG capsule Take 1 capsule (100 mg total) by mouth 3 (three) times daily as needed. 08/23/18   Junie Spencer, FNP   metroNIDAZOLE (FLAGYL) 500 MG tablet Take 1 tablet (500 mg total) by mouth 2 (two) times daily. 08/23/18   Dahlia Byes A, NP  omeprazole (PRILOSEC) 40 MG capsule Take 1 capsule (40 mg total) by mouth daily. 06/01/16   Sandford Craze, NP  ondansetron (ZOFRAN ODT) 4 MG disintegrating tablet Take 1 tablet (4 mg total) by mouth every 8 (eight) hours as needed for nausea or vomiting. 06/01/16   Sandford Craze, NP    Family History Family History  Problem Relation Age of Onset  . Mental illness Maternal Grandmother   . Cancer Maternal Grandfather   . Heart disease Maternal Grandfather     Social History Social History   Tobacco Use  . Smoking status: Current Every Day Smoker    Types: E-cigarettes  . Smokeless tobacco: Never Used  . Tobacco comment: once a month  Substance Use Topics  . Alcohol use: Yes  . Drug use: Yes    Types: Marijuana    Comment: 2-3 times a month     Allergies   Diflucan [fluconazole]   Review of Systems Review of Systems  Constitutional:       Per HPI, otherwise negative  HENT:       Per HPI, otherwise negative  Respiratory:       Per HPI, otherwise negative  Cardiovascular:       Per HPI, otherwise negative  Gastrointestinal: Positive for abdominal pain and  nausea. Negative for vomiting.  Endocrine:       Negative aside from HPI  Genitourinary:       Last menstrual period ended 2 weeks ago.  Musculoskeletal:       Per HPI, otherwise negative  Skin: Negative.   Neurological: Negative for syncope.     Physical Exam Updated Vital Signs BP 105/69   Pulse 100   Temp 98.4 F (36.9 C) (Oral)   Resp 18   SpO2 100%   Physical Exam Vitals signs and nursing note reviewed.  Constitutional:      General: She is not in acute distress.    Appearance: She is well-developed.  HENT:     Head: Normocephalic and atraumatic.  Eyes:     Conjunctiva/sclera: Conjunctivae normal.  Cardiovascular:     Rate and Rhythm: Normal rate and regular  rhythm.  Pulmonary:     Effort: Pulmonary effort is normal. No respiratory distress.     Breath sounds: Normal breath sounds. No stridor.  Abdominal:     General: There is no distension.     Comments: Patient denies actual discomfort anywhere, but indicates that when she has pain episodes and they occur in the right lower quadrant, just superior to her inguinal crease.  Skin:    General: Skin is warm and dry.  Neurological:     Mental Status: She is alert and oriented to person, place, and time.     Cranial Nerves: No cranial nerve deficit.      ED Treatments / Results  Labs (all labs ordered are listed, but only abnormal results are displayed) Labs Reviewed  COMPREHENSIVE METABOLIC PANEL - Abnormal; Notable for the following components:      Result Value   CO2 20 (*)    Glucose, Bld 102 (*)    All other components within normal limits  URINALYSIS, ROUTINE W REFLEX MICROSCOPIC - Abnormal; Notable for the following components:   Ketones, ur 80 (*)    All other components within normal limits  LIPASE, BLOOD  CBC  I-STAT BETA HCG BLOOD, ED ([facility], [facility], [facility] ONLY)    Radiology No results found.  Procedures Procedures (including critical care time)  Medications Ordered in ED Medications  sodium chloride flush (NS) 0.9 % injection 3 mL (has no administration in time range)  sodium chloride 0.9 % bolus 1,000 mL (1,000 mLs Intravenous New Bag/Given 11/07/18 2200)     Initial Impression / Assessment and Plan / ED Course  I have reviewed the triage vital signs and the nursing notes.  Pertinent labs & imaging results that were available during my care of the patient were reviewed by me and considered in my medical decision making (see chart for details).    After initial evaluation reviewed the patient's paperwork from her clinic, including historical details about the past year or so with intermittent abdominal pain, prior CT scan with evidence for colitis.    11:48 PM Initial  results were reassuring, aside from ketone urea. Patient is receiving IV fluid resuscitation.  This generally well-appearing thin young female presents with new right lower quadrant pain. However, the patient has had similar episodes in the past, and other abdominal pain over the past year, has been evaluated previously, has been diagnosed with colitis.  Line she has not had additional GI evaluation with consideration of things such as IBS/celiac or other enteric/colonic phenomena. Here the patient is awake and alert, hemodynamically unremarkable, actually has no pain currently, though it is episodic, according  to her and her mother. Patient has no vaginal complaints, had recent unremarkable menstrual cycle, and there is lower suspicion for reproductive pathology including PID, though this remains on the differential. No evidence for urinary tract disease, with unremarkable urinalysis. On signout the patient is awaiting CT scan, and Dr. Preston FleetingGlick is aware of her presentation, will disposition appropriately. Final Clinical Impressions(s) / ED Diagnoses   Final diagnoses:  Right lower quadrant abdominal pain     Gerhard MunchLockwood, Jaslin Novitski, MD 11/07/18 2350

## 2018-11-07 NOTE — ED Triage Notes (Signed)
Pt with c/o abdominal pain r/t mucous in stool and GERD. She reports having on-going abdominal issues x1 year with weight loss. A/o at triage NAD.

## 2018-11-08 ENCOUNTER — Emergency Department (HOSPITAL_COMMUNITY): Payer: Self-pay

## 2018-11-08 ENCOUNTER — Encounter (HOSPITAL_COMMUNITY): Payer: Self-pay | Admitting: Radiology

## 2018-11-08 MED ORDER — IOHEXOL 300 MG/ML  SOLN
80.0000 mL | Freq: Once | INTRAMUSCULAR | Status: AC | PRN
  Administered 2018-11-08: 01:00:00 80 mL via INTRAVENOUS

## 2018-11-08 NOTE — ED Notes (Signed)
Pt called out c/o nausea and asks for some meds.

## 2018-11-08 NOTE — Discharge Instructions (Addendum)
Your evaluation today did not show any serious problem. Your symptoms are probably from irritable bowel syndrome. Please follow up with either your primary care provider or the gastroenterologist.

## 2018-11-08 NOTE — ED Provider Notes (Signed)
Care assumed from Dr. Vanita Panda, patient with right lower quadrant pain with CT scan pending.  CT is normal.  Chronicity of symptoms and lack of findings of significant pathology on CT or labs is strongly suggestive of irritable bowel syndrome.  This is explained to the patient and her mother.  She is referred to gastroenterology for follow-up.  Results for orders placed or performed during the hospital encounter of 11/07/18  Lipase, blood  Result Value Ref Range   Lipase 26 11 - 51 U/L  Comprehensive metabolic panel  Result Value Ref Range   Sodium 136 135 - 145 mmol/L   Potassium 3.6 3.5 - 5.1 mmol/L   Chloride 104 98 - 111 mmol/L   CO2 20 (L) 22 - 32 mmol/L   Glucose, Bld 102 (H) 70 - 99 mg/dL   BUN 11 6 - 20 mg/dL   Creatinine, Ser 0.55 0.44 - 1.00 mg/dL   Calcium 9.1 8.9 - 10.3 mg/dL   Total Protein 7.7 6.5 - 8.1 g/dL   Albumin 4.5 3.5 - 5.0 g/dL   AST 20 15 - 41 U/L   ALT 17 0 - 44 U/L   Alkaline Phosphatase 42 38 - 126 U/L   Total Bilirubin 0.6 0.3 - 1.2 mg/dL   GFR calc non Af Amer >60 >60 mL/min   GFR calc Af Amer >60 >60 mL/min   Anion gap 12 5 - 15  CBC  Result Value Ref Range   WBC 9.3 4.0 - 10.5 K/uL   RBC 4.67 3.87 - 5.11 MIL/uL   Hemoglobin 13.5 12.0 - 15.0 g/dL   HCT 41.3 36.0 - 46.0 %   MCV 88.4 80.0 - 100.0 fL   MCH 28.9 26.0 - 34.0 pg   MCHC 32.7 30.0 - 36.0 g/dL   RDW 12.5 11.5 - 15.5 %   Platelets 219 150 - 400 K/uL   nRBC 0.0 0.0 - 0.2 %  Urinalysis, Routine w reflex microscopic  Result Value Ref Range   Color, Urine YELLOW YELLOW   APPearance CLEAR CLEAR   Specific Gravity, Urine 1.020 1.005 - 1.030   pH 5.0 5.0 - 8.0   Glucose, UA NEGATIVE NEGATIVE mg/dL   Hgb urine dipstick NEGATIVE NEGATIVE   Bilirubin Urine NEGATIVE NEGATIVE   Ketones, ur 80 (A) NEGATIVE mg/dL   Protein, ur NEGATIVE NEGATIVE mg/dL   Nitrite NEGATIVE NEGATIVE   Leukocytes,Ua NEGATIVE NEGATIVE  I-Stat beta hCG blood, ED  Result Value Ref Range   I-stat hCG, quantitative <5.0  <5 mIU/mL   Comment 3           Ct Abdomen Pelvis W Contrast  Result Date: 11/08/2018 CLINICAL DATA:  Right lower quadrant pain EXAM: CT ABDOMEN AND PELVIS WITH CONTRAST TECHNIQUE: Multidetector CT imaging of the abdomen and pelvis was performed using the standard protocol following bolus administration of intravenous contrast. CONTRAST:  42mL OMNIPAQUE IOHEXOL 300 MG/ML  SOLN COMPARISON:  04/27/2018 FINDINGS: Lower chest: No acute abnormality. Hepatobiliary: No focal liver abnormality is seen. No gallstones, gallbladder wall thickening, or biliary dilatation. Pancreas: Unremarkable. No pancreatic ductal dilatation or surrounding inflammatory changes. Spleen: Normal in size without focal abnormality. Adrenals/Urinary Tract: Adrenal glands are unremarkable. Kidneys are normal, without renal calculi, focal lesion, or hydronephrosis. Bladder is unremarkable. Stomach/Bowel: Stomach is within normal limits. Appendix appears normal. No evidence of bowel wall thickening, distention, or inflammatory changes. Vascular/Lymphatic: No significant vascular findings are present. No enlarged abdominal or pelvic lymph nodes. Reproductive: Uterus and bilateral adnexa are unremarkable.  Other: Negative for free air.  Tiny free fluid in the pelvis Musculoskeletal: No acute or significant osseous findings. IMPRESSION: Negative. No CT evidence for acute intra-abdominal or pelvic abnormality. Negative for acute appendicitis. Trace free fluid in the pelvis Electronically Signed   By: Jasmine PangKim  Fujinaga M.D.   On: 11/08/2018 01:40      Dione BoozeGlick, Darienne Belleau, MD 11/08/18 [phone]

## 2018-11-08 NOTE — ED Notes (Signed)
Patient transported to CT 

## 2018-11-08 NOTE — ED Notes (Signed)
Notified CT that may scan w/ just ingesting 1 bottle of PO contrast

## 2018-11-21 ENCOUNTER — Encounter: Payer: Self-pay | Admitting: Family

## 2018-11-21 ENCOUNTER — Telehealth: Payer: Self-pay | Admitting: Family

## 2018-11-21 ENCOUNTER — Other Ambulatory Visit: Payer: Self-pay

## 2018-11-21 ENCOUNTER — Ambulatory Visit (INDEPENDENT_AMBULATORY_CARE_PROVIDER_SITE_OTHER): Payer: Self-pay | Admitting: Family

## 2018-11-21 VITALS — BP 99/54 | HR 99 | Temp 97.9°F | Resp 12 | Ht 61.0 in | Wt 85.8 lb

## 2018-11-21 DIAGNOSIS — R636 Underweight: Secondary | ICD-10-CM

## 2018-11-21 DIAGNOSIS — K581 Irritable bowel syndrome with constipation: Secondary | ICD-10-CM

## 2018-11-21 DIAGNOSIS — N6002 Solitary cyst of left breast: Secondary | ICD-10-CM

## 2018-11-21 MED ORDER — CEPHALEXIN 500 MG PO CAPS: 500.0000 mg | ORAL_CAPSULE | Freq: Three times a day (TID) | ORAL | 0 refills | Status: DC

## 2018-11-21 NOTE — Patient Instructions (Signed)
Please begin keflex (antibiotic) and apply warm compresses to left breast twice daily.  You should be contacted about your referral to GI and nutrition.

## 2018-11-21 NOTE — Telephone Encounter (Signed)
PER MYCHART MESSAGE Called left msg to arrange an appt in off on Tuesday or Friday ( Hello! I have this ball on my left nipple that grows and shrinks and has been hurting the past few days.)

## 2018-11-21 NOTE — Progress Notes (Signed)
Subjective:    Patient ID: Loretta Gomez, female    DOB: [birth year], 23 y.o.   MRN: [id number]  HPI  Pt presents today with c/o left nipple discomfort.  Reports no redness started a few months ago. More uncomfortable at the time of her period. Reports that there is no drainage.   Also c/o weight concern.  Reports that there max weight was 95 pounds. Reports that she went to the ER and was told that she has IBS.  Reports that she has mucoid stools and intermittent generalized abdominal pain.  She denies nausea/vomitting.   Reports that she has been eating 2 meals a day with snacks throughout day.   Wt Readings from Last 3 Encounters:  11/21/18 85 lb 12.8 oz (38.9 kg)  04/27/18 88 lb (39.9 kg)  06/01/16 85 lb 12.8 oz (38.9 kg)   Lab Results  Component Value Date   TSH 1.45 06/01/2016      Review of Systems See HPI  Past Medical History:  Diagnosis Date  . History of chicken pox      Social History   Socioeconomic History  . Marital status: Single    Spouse name: Not on file  . Number of children: Not on file  . Years of education: Not on file  . Highest education level: Not on file  Occupational History  . Not on file  Social Needs  . Financial resource strain: Not on file  . Food insecurity    Worry: Not on file    Inability: Not on file  . Transportation needs    Medical: Not on file    Non-medical: Not on file  Tobacco Use  . Smoking status: Current Every Day Smoker    Types: E-cigarettes  . Smokeless tobacco: Never Used  . Tobacco comment: once a month  Substance and Sexual Activity  . Alcohol use: Yes  . Drug use: Yes    Types: Marijuana    Comment: 2-3 times a month  . Sexual activity: Not Currently    Birth control/protection: Implant  Lifestyle  . Physical activity    Days per week: Not on file    Minutes per session: Not on file  . Stress: Not on file  Relationships  . Social Herbalist on phone: Not on file    Gets  together: Not on file    Attends religious service: Not on file    Active member of club or organization: Not on file    Attends meetings of clubs or organizations: Not on file    Relationship status: Not on file  . Intimate partner violence    Fear of current or ex partner: Not on file    Emotionally abused: Not on file    Physically abused: Not on file    Forced sexual activity: Not on file  Other Topics Concern  . Not on file  Social History Narrative   Lives with her mom   Student- online classes, [employer] in [employer]   No pets   Enjoys drawing, spending time with friends    Past Surgical History:  Procedure Laterality Date  . HERNIA REPAIR N/A 2010   right inguinal     Family History  Problem Relation Age of Onset  . Mental illness Maternal Grandmother   . Cancer Maternal Grandfather   . Heart disease Maternal Grandfather     Allergies  Allergen Reactions  . Diflucan [Fluconazole]  Shortness of breath    No current outpatient medications on file prior to visit.   No current facility-administered medications on file prior to visit.     BP (!) 99/54 (BP Location: Left Arm, Cuff Size: Normal)   Pulse 99   Temp 97.9 F (36.6 C) (Temporal)   Resp 12   Ht 5\' 1"  (1.549 m)   Wt 85 lb 12.8 oz (38.9 kg)   LMP 11/07/2018   SpO2 100%   BMI 16.21 kg/m       Objective:   Physical Exam Constitutional:      Appearance: She is well-developed.  Neck:     Musculoskeletal: Neck supple.     Thyroid: No thyromegaly.  Cardiovascular:     Rate and Rhythm: Normal rate and regular rhythm.     Heart sounds: Normal heart sounds. No murmur.  Pulmonary:     Effort: Pulmonary effort is normal. No respiratory distress.     Breath sounds: Normal breath sounds. No wheezing.  Skin:    General: Skin is warm and dry.  Neurological:     Mental Status: She is alert and oriented to person, place, and time.  Psychiatric:        Behavior: Behavior normal.         Thought Content: Thought content normal.        Judgment: Judgment normal.   breast:  Pea sized fluctuant cyst-like superficial lesion noted on left areola at 1 o'clock. Non-tender        Assessment & Plan:  Breast cyst- New. trial of keflex, warm compresses. Will recheck in 1 week. If no improvement plan referral for breast US/mammogram.   Underweight- will refer to nutritionist for help with nutrition/weight gain.   IBS- will refer to GI for further evaluation.

## 2018-11-24 ENCOUNTER — Telehealth: Payer: Self-pay | Admitting: Family

## 2018-11-24 NOTE — Telephone Encounter (Signed)
Patient is calling regarding the her left nipple and she has been using cephALEXin (KEFLEX) 500 MG capsule [[id number]]  Since Wednesday 11/22/2018.  The area is black. The patient is calling to get advise. Should she continue to use it. Please advise [phone]

## 2018-11-27 NOTE — Telephone Encounter (Signed)
Patient called Friday about her left breast cyst area turning black and blue. She reports is much better now and has a follow up appointment tomorrow.

## 2018-11-28 ENCOUNTER — Encounter: Payer: Self-pay | Admitting: Gastroenterology

## 2018-11-28 ENCOUNTER — Encounter: Payer: Self-pay | Admitting: Family

## 2018-11-28 ENCOUNTER — Other Ambulatory Visit: Payer: Self-pay

## 2018-11-28 ENCOUNTER — Ambulatory Visit (INDEPENDENT_AMBULATORY_CARE_PROVIDER_SITE_OTHER): Payer: Self-pay | Admitting: Family

## 2018-11-28 VITALS — BP 98/60 | HR 87 | Temp 97.9°F | Resp 16 | Ht 64.0 in | Wt 84.6 lb

## 2018-11-28 DIAGNOSIS — R634 Abnormal weight loss: Secondary | ICD-10-CM

## 2018-11-28 DIAGNOSIS — K589 Irritable bowel syndrome without diarrhea: Secondary | ICD-10-CM

## 2018-11-28 DIAGNOSIS — N6002 Solitary cyst of left breast: Secondary | ICD-10-CM

## 2018-11-28 NOTE — Progress Notes (Signed)
Subjective:    Patient ID: Loretta Gomez, female    DOB: 1996-03-04, 23 y.o.   MRN: [id number]  HPI  Patient presents today for follow up with her left breast cyst.  She reports some improvement with use of antibiotics.  Breast Cyst- last visit we gave her keflex.  Underweight-her mother is present with her today.  Mother reports concern about patient's weight.  Patient reports that she has intermittent abdominal discomfort which limits her portions.  She denies any negative body image concerns for complicated relationship with food.  She has not yet heard about her referral to nutrition. Wt Readings from Last 3 Encounters:  11/28/18 84 lb 9.6 oz (38.4 kg)  11/21/18 85 lb 12.8 oz (38.9 kg)  04/27/18 88 lb (39.9 kg)      Review of Systems See HPI  Past Medical History:  Diagnosis Date  . History of chicken pox      Social History   Socioeconomic History  . Marital status: Single    Spouse name: Not on file  . Number of children: Not on file  . Years of education: Not on file  . Highest education level: Not on file  Occupational History  . Not on file  Social Needs  . Financial resource strain: Not on file  . Food insecurity    Worry: Not on file    Inability: Not on file  . Transportation needs    Medical: Not on file    Non-medical: Not on file  Tobacco Use  . Smoking status: Current Every Day Smoker    Types: E-cigarettes  . Smokeless tobacco: Never Used  . Tobacco comment: once a month  Substance and Sexual Activity  . Alcohol use: Yes  . Drug use: Yes    Types: Marijuana    Comment: 2-3 times a month  . Sexual activity: Not Currently    Birth control/protection: Implant  Lifestyle  . Physical activity    Days per week: Not on file    Minutes per session: Not on file  . Stress: Not on file  Relationships  . Social Herbalist on phone: Not on file    Gets together: Not on file    Attends religious service: Not on file    Active  member of club or organization: Not on file    Attends meetings of clubs or organizations: Not on file    Relationship status: Not on file  . Intimate partner violence    Fear of current or ex partner: Not on file    Emotionally abused: Not on file    Physically abused: Not on file    Forced sexual activity: Not on file  Other Topics Concern  . Not on file  Social History Narrative   Lives with her mom   Student- online classes, [employer] in [employer]   No pets   Enjoys drawing, spending time with friends    Past Surgical History:  Procedure Laterality Date  . HERNIA REPAIR N/A 2010   right inguinal     Family History  Problem Relation Age of Onset  . Mental illness Maternal Grandmother   . Cancer Maternal Grandfather   . Heart disease Maternal Grandfather     Allergies  Allergen Reactions  . Diflucan [Fluconazole]     Shortness of breath    Current Outpatient Medications on File Prior to Visit  Medication Sig Dispense Refill  . cephALEXin (KEFLEX) 500 MG capsule  Take 1 capsule (500 mg total) by mouth 3 (three) times daily. 21 capsule 0   No current facility-administered medications on file prior to visit.     BP 98/60 (BP Location: Right Arm, Patient Position: Sitting, Cuff Size: Small)   Pulse 87   Temp 97.9 F (36.6 C) (Temporal)   Resp 16   Ht 5\' 4"  (1.626 m)   Wt 84 lb 9.6 oz (38.4 kg)   LMP 11/07/2018   SpO2 100%   BMI 14.52 kg/m       Objective:   Physical Exam Constitutional:      Appearance: She is underweight.  Neck:     Musculoskeletal: Neck supple.     Thyroid: No thyromegaly.  Cardiovascular:     Rate and Rhythm: Normal rate and regular rhythm.     Heart sounds: Normal heart sounds. No murmur.  Pulmonary:     Effort: Pulmonary effort is normal. No respiratory distress.     Breath sounds: Normal breath sounds. No wheezing.  Skin:    General: Skin is warm and dry.  Neurological:     Mental Status: She is alert and oriented  to person, place, and time.  Psychiatric:        Behavior: Behavior normal.        Thought Content: Thought content normal.        Judgment: Judgment normal.    Breast: Slight improvement in cyst noted on left areole a.  Small amount of fluctuance noted within the superficial cyst.       Assessment & Plan:  Weight loss-she has lost an additional pound.  I encouraged her to add high-calorie snacks throughout the day.  I also gave her handout on tips to increase her overall caloric intake.  I will ask our scheduler to check the status of her nutrition referral.  IBS-she has an upcoming appointment scheduled with GI.  Left breast cyst-only slightly improved.  Will obtain left breast ultrasound to further evaluate.

## 2018-11-28 NOTE — Patient Instructions (Signed)

## 2018-12-04 ENCOUNTER — Other Ambulatory Visit (HOSPITAL_COMMUNITY): Payer: Self-pay | Admitting: *Deleted

## 2018-12-04 DIAGNOSIS — N6320 Unspecified lump in the left breast, unspecified quadrant: Secondary | ICD-10-CM

## 2018-12-08 ENCOUNTER — Ambulatory Visit: Payer: Self-pay | Admitting: Gastroenterology

## 2018-12-20 ENCOUNTER — Encounter: Payer: Self-pay | Admitting: Gastroenterology

## 2018-12-20 ENCOUNTER — Other Ambulatory Visit: Payer: Self-pay

## 2018-12-20 ENCOUNTER — Ambulatory Visit (INDEPENDENT_AMBULATORY_CARE_PROVIDER_SITE_OTHER): Payer: Self-pay | Admitting: Gastroenterology

## 2018-12-20 VITALS — BP 96/58 | HR 88 | Wt 85.0 lb

## 2018-12-20 DIAGNOSIS — R634 Abnormal weight loss: Secondary | ICD-10-CM

## 2018-12-20 DIAGNOSIS — K5900 Constipation, unspecified: Secondary | ICD-10-CM

## 2018-12-20 DIAGNOSIS — K921 Melena: Secondary | ICD-10-CM

## 2018-12-20 DIAGNOSIS — R1084 Generalized abdominal pain: Secondary | ICD-10-CM

## 2018-12-20 NOTE — Progress Notes (Signed)
Chief Complaint: Alternating bowel habits, abdominal pain, weight loss  Referring Provider:     Debbrah Alar, NP  HPI:     Loretta Gomez is a 23 y.o. female referred to the [facility]ology [facility] for evaluation of alternating bowel habits and abdominal pain.  She describes alternating bowel habits (constipation predominant) and abdominal pain for the last year or so. Started as generalized abdominal pain, worse in RLQ. Pain is intermittent, usually lasting 2-3 days, and worse with PO so she stops eating during flares. No n/v/f/c, dysphagia, early satiety.  Denies sitophobia.  No night sweats.  Some improvement in pain with BM, but not always. No nocturnal pain. No nocturnal stools.   Had lost approx 15 lbs since onset. She states she has had improvement since trialing GFD and low FODMAP diet recently. Has actually gained weight (3#) since employing GFD.  Symptoms worse with greasy foods, fried foods, spicy foods.  Denies any history of anorexia nervosa, self-induced emesis, bulimia, etc. no body image issues.   She was seen by her PCM for this issue in 11/2018 along with concern for being underweight (BMI 14.6).  Was also seen in the ER for RLQ pain and change in bowel habits on 11/07/2018.  Normal lipase, CMP, CBC, UA, hCG negative.  CT normal and was diagnosed with IBS with plan to follow-up with [facility] and GI referral.  Was also seen in the ER in 04/2018 with diarrhea, bloody stool, weakness, nausea.  CT at that time with mildly thickened splenic flexure through descending colon/sigmoid colon with mild mucosal enhancement.  Otherwise unremarkable.  WBC was 11.9, otherwise normal CBC, CMP.  FOBT positive.  No recurrence of hematochezia since then, but does have mucus-like stools intermittently.   No previous EGD or colonoscopy.  No known family history of CRC, GI malignancy, liver disease, pancreatic disease, or IBD.    Past Medical History:  Diagnosis Date   . History of chicken pox      Past Surgical History:  Procedure Laterality Date  . HERNIA REPAIR N/A 2010   right inguinal    Family History  Problem Relation Age of Onset  . Mental illness Maternal Grandmother   . Cancer Maternal Grandfather   . Heart disease Maternal Grandfather    Social History   Tobacco Use  . Smoking status: Current Every Day Smoker    Types: E-cigarettes  . Smokeless tobacco: Never Used  . Tobacco comment: once a month  Substance Use Topics  . Alcohol use: Yes  . Drug use: Yes    Types: Marijuana    Comment: 2-3 times a month   No current outpatient medications on file.   No current facility-administered medications for this visit.    Allergies  Allergen Reactions  . Diflucan [Fluconazole]     Shortness of breath     Review of Systems: All systems reviewed and negative except where noted in HPI.     Physical Exam:    Wt Readings from Last 3 Encounters:  12/20/18 85 lb (38.6 kg)  11/28/18 84 lb 9.6 oz (38.4 kg)  11/21/18 85 lb 12.8 oz (38.9 kg)    BP (!) 96/58   Pulse 88   Wt 85 lb (38.6 kg)   HC 64" (162.6 cm)   BMI 14.59 kg/m  Constitutional:  Pleasant, very thin female, in no acute distress. Psychiatric: Normal mood and affect. Behavior is normal. EENT: Pupils normal.  Conjunctivae  are normal. No scleral icterus. Neck supple. No cervical LAD. Cardiovascular: Normal rate, regular rhythm. No edema Pulmonary/chest: Effort normal and breath sounds normal. No wheezing, rales or rhonchi. Abdominal: Soft, nondistended, nontender. Bowel sounds active throughout. There are no masses palpable. No hepatomegaly. Neurological: Alert and oriented to person place and time. Skin: Skin is warm and dry. No rashes noted.   ASSESSMENT AND PLAN;   1) Generalized abdominal pain 2) Change in bowel habits 3) Weight loss  -Discussed the broad DDX for her presenting symptoms, to include Celiac Disease, IBD, IBS, vascular etiology, etc.  Discussed my elevated concerns given degree of weight loss, mucus-like and bloody stools.  We discussed EGD and colonoscopy at length today, but she adamantly refuses any endoscopic procedures. - Celiac panl (albeit limited due to GFD currently and does not want to alter given improvement in sxs) - Check ESR, CRP - Check B12, folate, iron panel, Vit D for malabsorption given wt loss - Fiber supplement - Discussed anorexia - No sitophobia, more so avoids eating as this exacerbates the pain during flares, so holding off on CT angiography -Cross-sectional imaging x2 otherwise without apparent malignancy  4) Hematochezia 5) FOBT positive stools - Mucus-like stools with blood in 04/2018, with left-sided colitis noted on CT at that time.  IBD certainly a possible etiology.  States that the hematochezia has since resolved, but still with mucus-like stools intermittently.  As above, strongly recommended endoscopic evaluation, which she again politely declined.  Discussed inability to rule out active mucosal/luminal etiology, malignancy, etc. without EGD/colonoscopy.  She understands, and again declines.  Rectal exam declined.  RTC in 3 months or sooner prn   Lavena Bullion, DO, FACG  12/20/2018, 3:40 PM   Debbrah Alar, NP

## 2018-12-20 NOTE — Patient Instructions (Signed)
If you are age 22 or older, your body mass index should be between 23-30. Your Body mass index is 14.59 kg/m. If this is out of the aforementioned range listed, please consider follow up with your Primary Care Provider.  If you are age 28 or younger, your body mass index should be between 19-25. Your Body mass index is 14.59 kg/m. If this is out of the aformentioned range listed, please consider follow up with your Primary Care Provider.   To help prevent the possible spread of infection to our patients, communities, and staff; we will be implementing the following measures:  As of now we are not allowing any visitors/family members to accompany you to any upcoming appointments with [facility]. If you have any concerns about this please contact our office to discuss prior to the appointment.   Your provider has requested that you go to the basement level for lab work at our [place] location ([address]. [place] [address]) . Press "B" on the elevator. The lab is located at the first door on the left as you exit the elevator. You may go at whatever time is convienent for you. The current hours of operations are Monday- Friday 7:30am-4:30pm.  Please start taking a daily fiber supplement such as citrucel, benefiber, metamucil, or fiber choice.  You have been give a sample of  Fiber Choice, please take as directed.  It was a pleasure to see you today!  Vito Cirigliano, D.O.

## 2018-12-27 ENCOUNTER — Encounter: Payer: Self-pay | Admitting: Registered"

## 2018-12-27 ENCOUNTER — Other Ambulatory Visit: Payer: Self-pay

## 2018-12-27 ENCOUNTER — Encounter: Payer: Self-pay | Attending: Family | Admitting: Registered"

## 2018-12-27 ENCOUNTER — Other Ambulatory Visit (INDEPENDENT_AMBULATORY_CARE_PROVIDER_SITE_OTHER): Payer: Self-pay

## 2018-12-27 DIAGNOSIS — R1084 Generalized abdominal pain: Secondary | ICD-10-CM

## 2018-12-27 DIAGNOSIS — R636 Underweight: Secondary | ICD-10-CM | POA: Insufficient documentation

## 2018-12-27 DIAGNOSIS — K921 Melena: Secondary | ICD-10-CM

## 2018-12-27 DIAGNOSIS — R634 Abnormal weight loss: Secondary | ICD-10-CM

## 2018-12-27 DIAGNOSIS — K5900 Constipation, unspecified: Secondary | ICD-10-CM

## 2018-12-27 LAB — FERRITIN: Ferritin: 32.4 ng/mL (ref 10.0–291.0)

## 2018-12-27 LAB — C-REACTIVE PROTEIN: CRP: <1 mg/dL (ref 0.5–20.0)

## 2018-12-27 LAB — SEDIMENTATION RATE: Sed Rate: 2 mm/hr (ref 0–20)

## 2018-12-27 LAB — B12 AND FOLATE PANEL
Folate: 9.8 ng/mL (ref >5.9)
Vitamin B-12: 307 pg/mL (ref 211–911)

## 2018-12-27 LAB — VITAMIN D 25 HYDROXY (VIT D DEFICIENCY, FRACTURES): VITD: 21.66 ng/mL — ABNORMAL LOW (ref 30.00–100.00)

## 2018-12-27 NOTE — Patient Instructions (Addendum)
Instructions/Goals:  Include 4-6 meals/snacks per day.  Try to eat within 1 hour of waking and about every 2 hours while awake.   Have a protein and starch with every meal.   Include high calorie ingredients such as:   nuts and nut butters (may do almond instead of peanut butter if it works better)  Oils, mayo, dressings, cheeses, whole fat lactose free milk, yogurt, etc  Recommend including a multivitamin. Can do Flintstones with iron.

## 2018-12-27 NOTE — Progress Notes (Addendum)
Medical Nutrition Therapy:  Appt start time: 2376 end time:  1605.  Assessment:  Primary concerns today: Pt referred due to dx underweight. Pt present for appointment alone.   Pt reports she started having GI problems about 1 year ago. Reports weight was 95 lb at highest, reports she thinks weight loss started due to birth control pills in the past which reduced appetite and weight. Pt reports in the ER they told her she has IBS. Reports her gastroenterologist told her she could have IBS, IBD, or celiac disease. Celiac disease blood test came back negative today. Per GI note, pt was not open to have further testing to further identify possible cause. Pt has been following a gluten free diet (GFD) and low FODMAP diet and reports since going GFD and avoiding certain fruits and vegetables she has not been having pain like before. Reports she was having abdominal pain before. Reports since GFD she feels better but symptoms come back if she consumes greasy foods or those with gluten. Reports she has been following these diets since August of this year. Reports appetite has been good since changing intake except on period. When asked about what food she consumes, pt looked up list on her phone of foods she includes currently.   Pt reports she was taking fiber supplements but stopped because they caused stomach pain. Pt reports that swallowing pills are difficult for her. Pt was open to taking a chewable multivitamin supplement. Pt reports that she tried Ensure, Boost in the past and reports they made her feel nauseous and she did not feel like eating. Pt reports she would rather try increasing her solid foods intake first because she is worried drinking a supplement will cause her to lose weight. Pt appeared worried about the effects of supplemental drinks. Pt asked if she could look into vegan supplemental drinks and was open to trying some plant based supplemental drinks if including more solid food was not enough  to help reach needs/goals. Pt reports her sister used to follow a vegan diet and uses a lot of vegan recipes. Pt reports she likes a lot of vegan foods. Pt does include meat sometimes but reports not every day.   Pt reports she limited calorie intake when she was younger and wanted to lose weight. She reports dieting as a middle schooler and eating 2 very small meals to limit intake around that age. Denies purging in the past or present. Pt reports when she later saw how thin she had become she stopped trying to restrict. Reports now she feels she looked better in old pictures when she weighed more and wants to gain weight. Would like to weight more than 100 lb, reports 95 lb is the highest she has ever weighed. Pt would like to know how long it typically takes to gain that amount of weight. Pt also wants to know if dieting when she was younger could have caused the GI problems and weight loss she is having now.   Pt reports no changes in energy. Denies any dizziness, fainting or shakiness. Sometimes may have these s/s when she goes a long time without eating. Reports no changes in nails. Reports irregular periods but reports this is usual for her. Reports having stress with school. Reports less appetite with depression but not stress. Pt is in online college for fashion marketing.   Food Allergies/Intolerances: lobster   GI Concerns: Reports when not following diet (GFD and low FODMAP) will have diarrhea and abdominal pain  but not otherwise. Denies vomiting.  EAT-26 Assessment: 2 (score based on answers provided: Insignificant) Answered "Once a month or less." for behavioral question A. Answered no to all other behavioral questions.  Pertinent Lab Values: 12/27/18:  Vitamin D: 21.66 (pt has been prescribed supplement)   Noted B12, folate, and ferritin was WNL. Celiac Disease panel was negative.   Weight Hx: Pt reports highest weight was 95 lb.  12/27/18: 84 lb 11 oz (no shoes) (Initial  appointment)   Preferred Learning Style:   No preference indicated   Learning Readiness:  Ready  MEDICATIONS: See list.    DIETARY INTAKE:  Usual eating pattern includes 2-3 meals and some snacks.   Common foods: see below.  Avoided foods: fast food; garlic; onion; beans; spicy foods; limits spinach intake; on GFD. Reports she cannot eat burgers so she eats kale burgers. Reports she can do [place] burgers. Avoids Nutella due to hurting stomach and too much sugar per pt. Reports she does like the taste of Nutella. Reports she loves the taste of avocados but cannot eat them d/t causing stomach pain. Pt reports that she does not include a lot of meat in diet, reports she does not have meat every day and cannot include beans d/t causing GI issues. Pt was open to include more fish, chicken along with other forms of protein (eggs, cheese, chickpea, nuts, etc).   Typical Snacks: yogurt, granola, cookies, chips, nuts, kale salad.      Typical Beverages: water; lactose free milk; organic pineapple juice.   Location of Meals: kitchen. Sometimes alone, sometimes with family.   Electronics Present at [employer]: Yes  Preferred/Accepted Foods:  Grains/Starches: rice (white and brown); GF breads; potatoes; corn (limits it); sweet potatoes (limits);chick peas Proteins: chicken plain (baked or grilled) with no seasoning; salmon; tilapia; tuna; sometime beef but not usually; no pork; peanuts; almonds; macadamia; pine nuts; pecans; walnuts; peanut butter (reports it makes her feel anxious like she has had caffeine, however); egg whites and organic eggs.  Vegetables: kale (likes peanut butter and kale smoothie with blueberries), tomatoes, carrots, celery; eggplant, broccoli, cucumbers, green beans, zucchini, squash Fruits: grapes, pineapple, blueberries, cantaloupe, kiwi, orange, strawberries, raspberries, pears.   Dairy: goat cheese; parm, mozzarella , cheddar cheese; lactaid milk; organic regular yogurt   Sauces/Dips/Spreads: peanut butter; ranch; ketchup;  Beverages: water; lactose free milk; pineapple juice.  Other: vegan foods-pretzels, pizza.   24-hr recall:  B (7-8 AM): egg whites with ketchup, organic pineapple juice, water  Snk ( AM): None reported.  L (11-12 ): yogurt with granola bar  Snk (PM) grapes and cheese  Snk (2-3 PM): kale salad with ranch, GF alfredo pasta  D (7 PM): cup of corn, mayo, cheese Snk ( PM): None reported.  Beverages: 1 bottle (16 oz) water; 8 oz juice  Usual physical activity: walking Minutes/Week: not regularly.   Estimated energy needs: 1747 calories 197-284 g carbohydrates 31 g protein 39-68 g fat  Progress Towards Goal(s):  In progress.   Nutritional Diagnosis:  NI-5.11.1 Predicted suboptimal nutrient intake As related to multiple reported dietary restrictions .  As evidenced by pt reports following GFD and low FODMAP and avoiding multiple other foods; BMI <15.    Intervention:  Nutrition counseling provided. Dietitian discussed including several small meals/snacks consistently throughout the day to help increase nutrition without inducing GI discomfort. Discussed high energy foods to help with weight gain within those pt is comfortable including/reports she can tolerate well. Discussed that it is possible restrictive behaviors  in the past could cause the stomach to shrink in size and make eating regular portions more difficult. Discussed that stomach size can increase to necessary size overtime as intake increases. Discussed plant based supplemental drinks Jae Dire([employer] and MaizeOrgain) and that these can be helpful if pt would like to try them along with increasing solid food intake. Pt was agreeable to taking a sample of each to try. Discussed that pt can feel free to let dietitian know if she feel uncomfortable or unsure about any MNT or goals discussed so we can work together to help meet pt's needs/goals. Pt was agreeable. Discussed pt taking a chewable  vitamin such as Flintstones as pt reports taking pills is difficult for her. Discussed need for protein at each meal and various types of protein, both plant and animal proteins. Pt was agreeable to increasing protein intake. Discussed trying almond butter as pt reports that peanut butter makes her feel anxious as if she has had coffee. Pt appeared agreeable to information/goals discussed.   Instructions/Goals:  Include 4-6 meals/snacks per day.  Try to eat within 1 hour of waking and about every 2 hours while awake.   Have a protein and starch with every meal.   Include high calorie ingredients such as:   nuts and nut butters (may do almond instead of peanut butter if it works better)  Oils, mayo, dressings, cheeses, whole fat lactose free milk, yogurt, etc  Recommend including a multivitamin. Can do Flintstones with iron.  Instructions/Goals:  Include 4-6 meals/snacks per day.  Try to eat within 1 hour of waking and about every 2 hours while awake.   Have a protein and starch with every meal.   Include high calorie ingredients such as:   nuts and nut butters (may do almond instead of peanut butter if it works better)  Oils, mayo, dressings, cheeses, whole fat lactose free milk, yogurt, etc  Recommend including a multivitamin. Can do Flintstones with iron.  Teaching Method Utilized:  Visual Auditory  Barriers to learning/adherence to lifestyle change: reports multiple dietary restrictions.   Demonstrated degree of understanding via:  Teach Back   Monitoring/Evaluation:  Dietary intake, exercise, and body weight in 2 week(s). Pt reports she will call back to schedule f/u appointment. Recommended f/u in next 2 weeks.

## 2018-12-29 LAB — CELIAC DISEASE PANEL
(tTG) Ab, IgA: 1 U/mL
(tTG) Ab, IgG: 1 U/mL
Gliadin IgA: 3 Units
Gliadin IgG: 2 Units
Immunoglobulin A: 208 mg/dL (ref 47–310)

## 2019-01-02 ENCOUNTER — Encounter (HOSPITAL_COMMUNITY): Payer: Self-pay

## 2019-01-02 ENCOUNTER — Ambulatory Visit
Admission: RE | Admit: 2019-01-02 | Discharge: 2019-01-02 | Disposition: A | Discharge status: Home / Self Care | Payer: No Typology Code available for payment source | Source: Ambulatory Visit | Attending: Obstetrics and Gynecology | Admitting: Obstetrics and Gynecology

## 2019-01-02 ENCOUNTER — Other Ambulatory Visit: Payer: Self-pay

## 2019-01-02 ENCOUNTER — Ambulatory Visit (HOSPITAL_COMMUNITY)
Admission: RE | Admit: 2019-01-02 | Discharge: 2019-01-02 | Disposition: A | Discharge status: Home / Self Care | Payer: Self-pay | Source: Ambulatory Visit | Attending: Obstetrics and Gynecology | Admitting: Obstetrics and Gynecology

## 2019-01-02 ENCOUNTER — Encounter: Payer: Self-pay | Admitting: Family

## 2019-01-02 DIAGNOSIS — N6321 Unspecified lump in the left breast, upper outer quadrant: Secondary | ICD-10-CM | POA: Insufficient documentation

## 2019-01-02 DIAGNOSIS — K921 Melena: Secondary | ICD-10-CM

## 2019-01-02 DIAGNOSIS — R1084 Generalized abdominal pain: Secondary | ICD-10-CM

## 2019-01-02 DIAGNOSIS — N6320 Unspecified lump in the left breast, unspecified quadrant: Secondary | ICD-10-CM

## 2019-01-02 DIAGNOSIS — R634 Abnormal weight loss: Secondary | ICD-10-CM

## 2019-01-02 DIAGNOSIS — K5900 Constipation, unspecified: Secondary | ICD-10-CM

## 2019-01-02 DIAGNOSIS — E559 Vitamin D deficiency, unspecified: Secondary | ICD-10-CM

## 2019-01-02 DIAGNOSIS — Z01419 Encounter for gynecological examination (general) (routine) without abnormal findings: Secondary | ICD-10-CM | POA: Insufficient documentation

## 2019-01-02 MED ORDER — VITAMIN D (ERGOCALCIFEROL) 1.25 MG (50000 UNIT) PO CAPS: 50000.0000 [IU] | ORAL_CAPSULE | ORAL | 0 refills | Status: DC

## 2019-01-02 MED ORDER — VITAMIN D 50 MCG (2000 UT) PO CAPS: 2000.0000 [IU] | ORAL_CAPSULE | Freq: Every day | ORAL | 5 refills | Status: DC

## 2019-01-02 NOTE — Progress Notes (Signed)
Complaints of left breast lump x 3 months.  Pap Smear: Pap smear completed today. Per patient has never had a Pap smear completed. No Pap smear results are in Epic.  Physical exam: Breasts Breasts symmetrical. No skin abnormalities bilateral breasts. No nipple retraction bilateral breasts. No nipple discharge bilateral breasts. No lymphadenopathy. No lumps palpated right breast. Palpated a bb sized lump within the left breast at 1 o'clock 5 cm from the nipple. Complaints of bilateral diffuse breast tenderness on exam. Per patient her menstrual cycle is due soon. Referred patient to the [place] for a left breast ultrasound. Appointment scheduled for Tuesday, January 02, 2019 at 1340.        Pelvic/Bimanual   Ext Genitalia No lesions, no swelling and no discharge observed on external genitalia.         Vagina Vagina pink and normal texture. No lesions or discharge observed in vagina.          Cervix Cervix is present. Cervix pink and of normal texture. No discharge observed.     Uterus Uterus is present and palpable. Uterus in normal position and normal size.        Adnexae Bilateral ovaries present and palpable. No tenderness on palpation.         Rectovaginal No rectal exam completed today since patient had no rectal complaints. No skin abnormalities observed on exam.    Smoking History: Patient currently vapes. Discussed smoking cessation with patient. Referred to the [facility] Quitline and gave resources to the free smoking cessation classes at [facility].  Patient Navigation: Patient education provided. Access to services provided for patient through [facility] program.   Breast and Cervical Cancer Risk Assessment: Patient has a family history of her maternal grandfather having breast cancer. Patient has no known genetic mutations or history of radiation treatment to the chest before age 3. Patient has no history of cervical dysplasia, immunocompromised, or DES exposure  in-utero. Breast cancer risk assessment completed. No breast cancer risk calculated due to patient is less than 63 years old.

## 2019-01-02 NOTE — Telephone Encounter (Signed)
Please see result note for documentation 

## 2019-01-02 NOTE — Patient Instructions (Addendum)
Explained breast self awareness with Verdis Frederickson Orpinel-Cereceres. Let patient know [facility] will cover Pap smears every 3 years unless has a history of abnormal Pap smears. Referred patient to the [place] for a left breast ultrasound. Appointment scheduled for Tuesday, January 02, 2019 at 1340. Patient aware of appointment and will be there. Let patient know will follow up with her within the next couple weeks with results of Pap smear by letter or phone. Discussed smoking cessation with patient. Referred to the [facility] Quitline and gave resources to the free smoking cessation classes at [facility]. Dalissa Orpinel-Cereceres verbalized understanding.  Marlene Beidler, Arvil Ch[place], RN 12:50 PM

## 2019-01-03 ENCOUNTER — Encounter: Payer: Self-pay | Admitting: Gastroenterology

## 2019-01-03 ENCOUNTER — Ambulatory Visit (INDEPENDENT_AMBULATORY_CARE_PROVIDER_SITE_OTHER): Payer: No Typology Code available for payment source | Admitting: Gastroenterology

## 2019-01-03 ENCOUNTER — Other Ambulatory Visit (INDEPENDENT_AMBULATORY_CARE_PROVIDER_SITE_OTHER): Payer: No Typology Code available for payment source

## 2019-01-03 VITALS — BP 100/60 | HR 62 | Temp 97.3°F | Ht 64.0 in | Wt 86.0 lb

## 2019-01-03 DIAGNOSIS — K921 Melena: Secondary | ICD-10-CM

## 2019-01-03 DIAGNOSIS — E559 Vitamin D deficiency, unspecified: Secondary | ICD-10-CM

## 2019-01-03 DIAGNOSIS — R1031 Right lower quadrant pain: Secondary | ICD-10-CM

## 2019-01-03 DIAGNOSIS — R1084 Generalized abdominal pain: Secondary | ICD-10-CM

## 2019-01-03 DIAGNOSIS — R195 Other fecal abnormalities: Secondary | ICD-10-CM

## 2019-01-03 DIAGNOSIS — R634 Abnormal weight loss: Secondary | ICD-10-CM

## 2019-01-03 DIAGNOSIS — K5900 Constipation, unspecified: Secondary | ICD-10-CM

## 2019-01-03 LAB — CYTOLOGY - PAP
Adequacy: ABSENT
Diagnosis: NEGATIVE

## 2019-01-03 LAB — VITAMIN D 25 HYDROXY (VIT D DEFICIENCY, FRACTURES): VITD: 20.44 ng/mL — ABNORMAL LOW (ref 30.00–100.00)

## 2019-01-03 NOTE — Patient Instructions (Signed)
If you are age 22 or older, your body mass index should be between 23-30. Your Body mass index is 14.76 kg/m. If this is out of the aforementioned range listed, please consider follow up with your Primary Care Provider.  If you are age 64 or younger, your body mass index should be between 19-25. Your Body mass index is 14.76 kg/m. If this is out of the aformentioned range listed, please consider follow up with your Primary Care Provider.   To help prevent the possible spread of infection to our patients, communities, and staff; we will be implementing the following measures:  As of now we are not allowing any visitors/family members to accompany you to any upcoming appointments with [facility]. If you have any concerns about this please contact our office to discuss prior to the appointment.   Please call our office at [phone] to set up your 6 month follow up visit.  Your provider has requested that you go to the basement level for lab work at our [place] location ([place]. [place] [address]) . Press "B" on the elevator. The lab is located at the first door on the left as you exit the elevator. You may go at whatever time is convienent for you. The current hours of operations are Monday- Friday 7:30am-4:30pm.  It was a pleasure to see you today!  Vito Cirigliano, D.O.

## 2019-01-03 NOTE — Progress Notes (Signed)
P  Chief Complaint:    Abdominal pain, altered bowel habits, weight loss  GI History: 23 year old female with alternating bowel habits (constipation predominant) and abdominal pain for the last year or so. Started as generalized abdominal pain, worse in RLQ. Pain is intermittent, usually lasting 2-3 days, and worse with PO so she stops eating during flares. No n/v/f/c, dysphagia, early satiety.  Denies sitophobia.  No night sweats.  Some improvement in pain with BM, but not always. No nocturnal pain. No nocturnal stools.  Hematochezia once in 04/2018 (ER evaluation as below).  No hematochezia since then, but does have intermittent mucus-like stools.  Had lost approx 15 lbs since onset. She states she has had improvement since trialing GFD and low FODMAP diet recently. Has actually gained weight (3#) since employing GFD.  Symptoms worse with greasy foods, fried foods, spicy foods.  Denies any history of anorexia nervosa, self-induced emesis, bulimia, etc. no body image issues.  Evaluation to date: -12/2018: Vitamin D deficiency (21.66), otherwise normal B12, folate, ferritin, ESR/CRP, Celiac panel  -ER (11/07/2018: Normal lipase, CMP, CBC, UA, hCG, CT -ER (04/2018 (diarrhea, bloody stool, weakness, nausea): CT with mildly thickened splenic flexure through descending colon/sigmoid colon.  FOBT positive, WBC 11.9, otherwise normal CBC, CMP.    HPI:    Patient is a 23 y.o. female presenting to the [facility] for follow-up.  Initially seen by me on 12/20/2018.  Weight 85 pounds at that time (BMI 14.6).  Adamantly refused EGD/colonoscopy.  Rectal exam declined.  Evaluation since then only notable for vitamin D deficiency (21.66), otherwise normal micronutrient evaluation, ESR/CRP.  Celiac panel was negative, although may have been on a gluten limited diet).  Prescribed ergocalciferol (hasnt started taking).  Was seen by dietitian on 12/27/2018.  Plan for 4-6 meals/snacks throughout  the day with high calorie ingredients added to her diet.  Recommended to continue multivitamin with iron.  Planned 2-week follow-up with diet review and weight check.  She states abdominal pain haD improved (but not resolved) with GFD and low FODMAP diet, but started to recur over the last couple of weeks.  Symptoms flare with greasy foods or gluten-containing foods.  Otherwise good appetite.  Stopped fiber supplement due to abdominal discomfort.  With negative celiac panel, she restarted gluten-containing foods yesterday.   Today, states she is back to most baseline sxs and no change at all despite other notes in EMR.  Still having mucus-like stools alternating with constipation. Still with abdominal pain, still usually RLQ, lasting 1-2 days at a time. Usually improves with BM.  Weight is mostly stable.  Does not want to eat 4-6 meals as recommended by the Dietitian.  Does not want to follow-up in the Nutrition clinic.   Review of systems:     No chest pain, no SOB, no fevers, no urinary sx   Past Medical History:  Diagnosis Date  . History of chicken pox     Patient's surgical history, family medical history, social history, medications and allergies were all reviewed in Epic    Current Outpatient Medications  Medication Sig Dispense Refill  . Vitamin D, Ergocalciferol, (DRISDOL) 1.25 MG (50000 UT) CAPS capsule Take 1 capsule (50,000 Units total) by mouth every 7 (seven) days. 8 capsule 0   No current facility-administered medications for this visit.     Physical Exam:     There were no vitals taken for this visit.  GENERAL:  Pleasant, very thin appearing female in NAD PSYCH: : Cooperative,  normal affect EENT:  conjunctiva pink, mucous membranes moist, neck supple without masses CARDIAC:  RRR, no murmur heard, no peripheral edema PULM: Normal respiratory effort, lungs CTA bilaterally, no wheezing ABDOMEN:  Nondistended, soft, nontender. No obvious masses, no hepatomegaly,  normal  bowel sounds SKIN:  turgor, no lesions seen Musculoskeletal:  Normal muscle tone, normal strength NEURO: Alert and oriented x 3, no focal neurologic deficits   IMPRESSION and PLAN:     1) Generalized abdominal pain 2) RLQ pain 3) Change in bowel habits 4) Weight loss 5) Mucus-like stools  While clinically seems most consistent with IBS exacerbated by times of stress/anxiety, we again discussed the mucus-like stools and significant weight loss with relation to other etiologies.  Again recommended EGD/colonoscopy.  She declined.  Offered just upper endoscopy, and she declined.    - Stool studies - Fecal calprotectin - Declines EGD/Col[facility] - Declines TCA trial - Declines referral to [facility] (has previously followed with a counselor) - Declines fiber supplement - Declines f/u with Dietician -Cross-sectional imaging x2 otherwise without apparent malignant etiology.  Did discuss the potential for vascular etiology for symptoms, but not endorsing sitophobia per se.  6) Malnutrition 7) Vitamin D deficiency -Recommended that she start the ergocalciferol as previously prescribed  8) Hematochezia 9) FOBT positive stools - Mucus-like stools with blood in 04/2018, with left-sided colitis noted on CT at that time.  IBD certainly a possible etiology.  States that the hematochezia has since resolved, but still with mucus-like stools intermittently.  As above, again strongly recommended endoscopic evaluation, which she again politely declined. Discussed inability to rule out active mucosal/luminal etiology, malignancy, etc. without EGD/colonoscopy.  She understands, and again declines.  Rectal exam declined.      RTC in 6 months or sooner prn    Lavena Bullion ,DO, FACG 01/03/2019, 10:57 AM

## 2019-01-04 ENCOUNTER — Encounter: Payer: Self-pay | Admitting: Family

## 2019-01-04 ENCOUNTER — Other Ambulatory Visit: Payer: No Typology Code available for payment source

## 2019-01-04 DIAGNOSIS — R1031 Right lower quadrant pain: Secondary | ICD-10-CM

## 2019-01-04 DIAGNOSIS — R195 Other fecal abnormalities: Secondary | ICD-10-CM

## 2019-01-09 LAB — CALPROTECTIN, FECAL: Calprotectin, Fecal: 107 ug/g (ref 0–120)

## 2019-01-16 LAB — GASTROINTESTINAL PATHOGEN PANEL PCR
C. difficile Tox A/B, PCR: NOT DETECTED
Campylobacter, PCR: NOT DETECTED
Cryptosporidium, PCR: NOT DETECTED
E coli (ETEC) LT/ST PCR: NOT DETECTED
E coli (STEC) stx1/stx2, PCR: NOT DETECTED
E coli 0157, PCR: NOT DETECTED
Giardia lamblia, PCR: NOT DETECTED
Norovirus, PCR: NOT DETECTED
Rotavirus A, PCR: NOT DETECTED
Salmonella, PCR: NOT DETECTED
Shigella, PCR: NOT DETECTED

## 2019-01-19 ENCOUNTER — Telehealth (HOSPITAL_COMMUNITY): Payer: Self-pay | Admitting: *Deleted

## 2019-01-19 NOTE — Telephone Encounter (Signed)
Called patient and gave her Pap smear result. Let patient know that her Pap smear was normal and that her next Pap smear is due in three years. Patient verbalized understanding.

## 2019-10-16 ENCOUNTER — Encounter: Payer: Self-pay | Admitting: Family

## 2019-10-22 ENCOUNTER — Encounter: Payer: Self-pay | Admitting: Family

## 2020-02-01 IMAGING — US US BREAST*L* LIMITED INC AXILLA
1 series · 2 of 2 positions shown · non-contrast
Comparison: None

CLINICAL DATA: 23-year-old female with palpable nodularity in the
UPPER-OUTER LEFT breast. Reported history of grandfather with breast
cancer.

EXAM:
ULTRASOUND OF THE LEFT BREAST

[Series 1: us breast*left* limited inc axilla · 0.06mm/px · 2 of 2 slices shown]
[im 1/2]
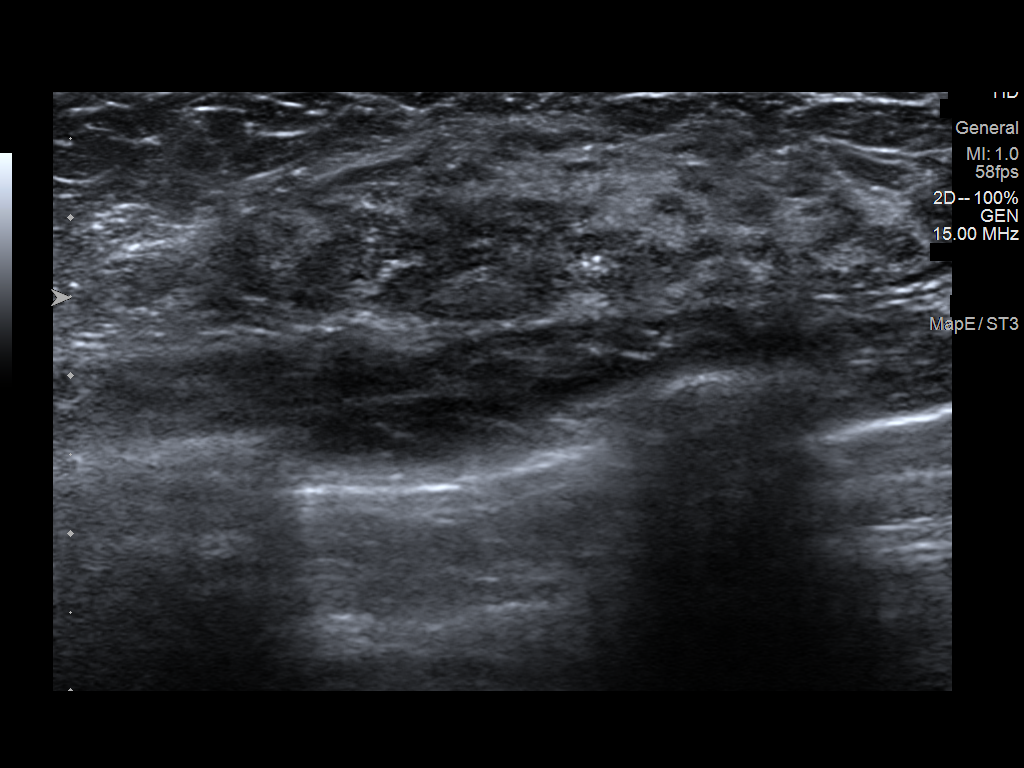
[im 2/2]
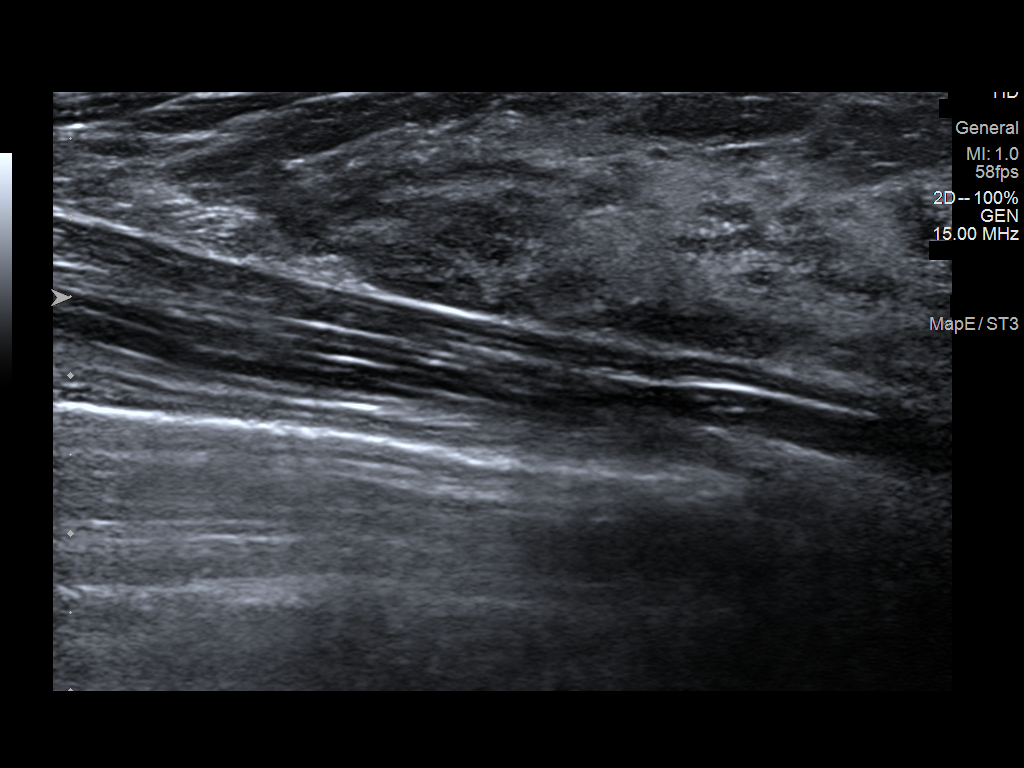

[2 of 2 positions shown; findings below may reference images not displayed]

FINDINGS: Targeted ultrasound is performed, showing normal dense
fibroglandular tissue within the UPPER OUTER LEFT breast. No solid
or cystic mass, distortion or abnormal shadowing noted.
IMPRESSION: No sonographic abnormality within the UPPER-OUTER LEFT breast.

RECOMMENDATION:
Clinical follow-up recommended for the area of concern in the LEFT
breast. Any further workup should be based on clinical grounds.
Bilateral screening mammogram at age 41.

Bilateral screening mammogram at age 41.

Consider genetic testing/counseling given family history of male
relative with breast cancer.

I have discussed the findings and recommendations with the patient.
If applicable, a reminder letter will be sent to the patient
regarding the next appointment.

BI-RADS CATEGORY  1: Negative.

## 2021-05-05 ENCOUNTER — Other Ambulatory Visit (HOSPITAL_COMMUNITY)
Admission: RE | Admit: 2021-05-05 | Discharge: 2021-05-05 | Disposition: A | Discharge status: Home / Self Care | Payer: BC Managed Care – PPO | Source: Ambulatory Visit | Attending: Family | Admitting: Family

## 2021-05-05 ENCOUNTER — Ambulatory Visit: Payer: BC Managed Care – PPO | Admitting: Family

## 2021-05-05 VITALS — BP 114/66 | HR 109 | Temp 98.7°F | Resp 16 | Wt 90.0 lb

## 2021-05-05 DIAGNOSIS — Z113 Encounter for screening for infections with a predominantly sexual mode of transmission: Secondary | ICD-10-CM | POA: Insufficient documentation

## 2021-05-05 DIAGNOSIS — F418 Other specified anxiety disorders: Secondary | ICD-10-CM | POA: Diagnosis not present

## 2021-05-05 NOTE — Assessment & Plan Note (Signed)
Scored poorly on PHQ-9 and GAD-7. We discussed medication trial, but she declines. She prefers to focus on natural treatments such as meditation. I encouraged her to schedule with an counselor to help with her depression/anxiety as well as her body dysmorphia.

## 2021-05-05 NOTE — Progress Notes (Signed)
Subjective:     Patient ID: Loretta Gomez, female    DOB: [birth year], 26 y.o.   MRN: [id number]  Chief Complaint  Patient presents with   STD screening    Will like to be tested for STD    HPI Patient is in today for STD screening.  She reports that she has had 2 partners in the last 1 year.  She denies any symptoms.   She reports that she has been trying to gain weight. Admits that ever since middle school she has "thought I am fat." Though she knows that she really isn't overweight, she has to fight with her mind to block intrusive thoughts about her weight.  She notes that she has been forcing herself to weigh and has also been lifting weights and this has been helping her. States she has done some counseling in the remote past but it has not been helpful.  Weight today is 90 lbs.   States that she is now attending [employer] and is living on campus with roommates that she likes.   Wt Readings from Last 3 Encounters:  05/05/21 90 lb (40.8 kg)  01/03/19 86 lb (39 kg)  01/02/19 84 lb (38.1 kg)     Health Maintenance Due  Topic Date Due   HIV Screening  Never done   Hepatitis C Screening  Never done   TETANUS/TDAP  09/24/2017   INFLUENZA VACCINE  Never done    Past Medical History:  Diagnosis Date   History of chicken pox     Past Surgical History:  Procedure Laterality Date   HERNIA REPAIR N/A 2010   right inguinal     Family History  Problem Relation Age of Onset   Mental illness Maternal Grandmother    Cancer Maternal Grandfather    Heart disease Maternal Grandfather    Hypertension Maternal Aunt    Diabetes Paternal Uncle     Social History   Socioeconomic History   Marital status: Single    Spouse name: Not on file   Number of children: Not on file   Years of education: Not on file   Highest education level: Not on file  Occupational History   Not on file  Tobacco Use   Smoking status: Never   Smokeless tobacco: Never   Tobacco comments:     once a month  Vaping Use   Vaping Use: Some days   Substances: CBD, Flavoring  Substance and Sexual Activity   Alcohol use: Not Currently   Drug use: Not Currently    Types: Marijuana    Comment: 2-3 times a month   Sexual activity: Not Currently    Birth control/protection: None  Other Topics Concern   Not on file  Social History Narrative   Lives with her mom   Consulting civil engineer- online classes, [employer] in [employer]   No pets   Enjoys drawing, spending time with friends   Social Determinants of Health   Financial Resource Strain: Not on file  Food Insecurity: Not on file  Transportation Needs: Not on file  Physical Activity: Not on file  Stress: Not on file  Social Connections: Not on file  Intimate Partner Violence: Not on file    Outpatient Medications Prior to Visit  Medication Sig Dispense Refill   Vitamin D, Ergocalciferol, (DRISDOL) 1.25 MG (50000 UT) CAPS capsule Take 1 capsule (50,000 Units total) by mouth every 7 (seven) days. 8 capsule 0   No facility-administered medications prior to  visit.    Allergies  Allergen Reactions   Diflucan [Fluconazole]     Shortness of breath   Lobster [Shellfish Allergy]     Lobster only per pt. Itching in and around mouth.      ROS See HPI    Objective:    Physical Exam Constitutional:      General: She is not in acute distress.    Appearance: Normal appearance. She is underweight. She is not ill-appearing.  HENT:     Head: Normocephalic and atraumatic.     Right Ear: External ear normal.     Left Ear: External ear normal.  Eyes:     General: No scleral icterus. Neck:     Thyroid: No thyromegaly.  Cardiovascular:     Rate and Rhythm: Normal rate and regular rhythm.     Heart sounds: Normal heart sounds. No murmur heard. Pulmonary:     Effort: Pulmonary effort is normal. No respiratory distress.     Breath sounds: Normal breath sounds. No wheezing.  Musculoskeletal:     Cervical back: Neck supple.   Skin:    General: Skin is warm and dry.  Neurological:     Mental Status: She is alert and oriented to person, place, and time.  Psychiatric:        Attention and Perception: Attention normal.        Mood and Affect: Mood is anxious.        Behavior: Behavior normal.        Thought Content: Thought content normal.        Cognition and Memory: Cognition normal.        Judgment: Judgment normal.    BP 114/66 (BP Location: Right Arm, Patient Position: Sitting, Cuff Size: Small)    Pulse (!) 109    Temp 98.7 F (37.1 C) (Oral)    Resp 16    Wt 90 lb (40.8 kg)    SpO2 100%    BMI 15.45 kg/m  Wt Readings from Last 3 Encounters:  05/05/21 90 lb (40.8 kg)  01/03/19 86 lb (39 kg)  01/02/19 84 lb (38.1 kg)       Assessment & Plan:   Problem List Items Addressed This Visit       Unprioritized   Screening for STD (sexually transmitted disease) - Primary    Labs as ordered.       Relevant Orders   HIV antibody (with reflex)   RPR   Hepatitis B Surface AntiGEN   Cervicovaginal ancillary only( [place])   Depression with anxiety    Scored poorly on PHQ-9 and GAD-7. We discussed medication trial, but she declines. She prefers to focus on natural treatments such as meditation. I encouraged her to schedule with an counselor to help with her depression/anxiety as well as her body dysmorphia.        I have discontinued Byrd Hesselbach Orpinel-Cereceres's Vitamin D (Ergocalciferol).  No orders of the defined types were placed in this encounter.

## 2021-05-05 NOTE — Assessment & Plan Note (Signed)
Labs as ordered

## 2021-05-05 NOTE — Patient Instructions (Addendum)
Please complete lab work prior to leaving.  [facility]- please call to look into scheduling an appointment with a counselor. [phone]

## 2021-05-06 LAB — CERVICOVAGINAL ANCILLARY ONLY
Chlamydia: NEGATIVE
Comment: NEGATIVE
Comment: NEGATIVE
Comment: NORMAL
Neisseria Gonorrhea: NEGATIVE
Trichomonas: NEGATIVE

## 2021-05-07 ENCOUNTER — Telehealth: Payer: Self-pay | Admitting: Family

## 2021-05-07 NOTE — Telephone Encounter (Signed)
I meant to also add hsv2 IgG but forgot. Can you please check with the lab to see if they can add this on to the specimen?

## 2021-05-11 NOTE — Telephone Encounter (Signed)
I have requested test be added w/[facility] @ [facility].

## 2021-05-12 LAB — TEST AUTHORIZATION

## 2021-05-12 LAB — RPR: RPR Ser Ql: NONREACTIVE

## 2021-05-12 LAB — HEPATITIS B SURFACE ANTIGEN: Hepatitis B Surface Ag: NONREACTIVE

## 2021-05-12 LAB — HIV ANTIBODY (ROUTINE TESTING W REFLEX): HIV 1&2 Ab, 4th Generation: NONREACTIVE

## 2021-05-12 LAB — HSV 2 ANTIBODY, IGG: HSV 2 Glycoprotein G Ab, IgG: <0.9 index

## 2021-05-13 NOTE — Telephone Encounter (Signed)
Test Authorization faxed to [facility] @ [phone]. ?

## 2022-08-25 ENCOUNTER — Emergency Department (HOSPITAL_COMMUNITY): Payer: Self-pay | Admitting: Certified Registered"

## 2022-08-25 ENCOUNTER — Other Ambulatory Visit: Payer: Self-pay

## 2022-08-25 ENCOUNTER — Emergency Department (HOSPITAL_BASED_OUTPATIENT_CLINIC_OR_DEPARTMENT_OTHER): Payer: Self-pay

## 2022-08-25 ENCOUNTER — Ambulatory Visit: Admit: 2022-08-25 | Payer: Self-pay | Admitting: Surgery

## 2022-08-25 ENCOUNTER — Emergency Department (HOSPITAL_BASED_OUTPATIENT_CLINIC_OR_DEPARTMENT_OTHER): Payer: Self-pay | Admitting: Certified Registered"

## 2022-08-25 ENCOUNTER — Ambulatory Visit (HOSPITAL_BASED_OUTPATIENT_CLINIC_OR_DEPARTMENT_OTHER)
Admission: EM | Admit: 2022-08-25 | Discharge: 2022-08-25 | Disposition: A | Discharge status: Home / Self Care | Payer: Self-pay | Attending: Surgery | Admitting: Surgery

## 2022-08-25 ENCOUNTER — Encounter (HOSPITAL_BASED_OUTPATIENT_CLINIC_OR_DEPARTMENT_OTHER): Payer: Self-pay

## 2022-08-25 ENCOUNTER — Encounter (HOSPITAL_COMMUNITY)
Admission: EM | Disposition: A | Discharge status: Home / Self Care | Payer: Self-pay | Source: Home / Self Care | Attending: Emergency Medicine

## 2022-08-25 DIAGNOSIS — K3580 Unspecified acute appendicitis: Secondary | ICD-10-CM | POA: Insufficient documentation

## 2022-08-25 DIAGNOSIS — K37 Unspecified appendicitis: Secondary | ICD-10-CM

## 2022-08-25 HISTORY — PX: APPENDECTOMY: SHX54

## 2022-08-25 HISTORY — PX: LAPAROSCOPY: SHX197

## 2022-08-25 LAB — URINALYSIS, ROUTINE W REFLEX MICROSCOPIC
Bilirubin Urine: NEGATIVE
Glucose, UA: NEGATIVE mg/dL
Ketones, ur: >=80 mg/dL — AB
Leukocytes,Ua: NEGATIVE
Nitrite: NEGATIVE
Protein, ur: NEGATIVE mg/dL
Specific Gravity, Urine: 1.025 (ref 1.005–1.030)
pH: 7 (ref 5.0–8.0)

## 2022-08-25 LAB — URINALYSIS, MICROSCOPIC (REFLEX)

## 2022-08-25 LAB — COMPREHENSIVE METABOLIC PANEL
ALT: 12 U/L (ref 0–44)
AST: 19 U/L (ref 15–41)
Albumin: 4.6 g/dL (ref 3.5–5.0)
Alkaline Phosphatase: 40 U/L (ref 38–126)
Anion gap: 10 (ref 5–15)
BUN: 11 mg/dL (ref 6–20)
CO2: 23 mmol/L (ref 22–32)
Calcium: 9.1 mg/dL (ref 8.9–10.3)
Chloride: 103 mmol/L (ref 98–111)
Creatinine, Ser: 0.5 mg/dL (ref 0.44–1.00)
GFR, Estimated: >60 mL/min (ref >60)
Glucose, Bld: 134 mg/dL — ABNORMAL HIGH (ref 70–99)
Potassium: 3.3 mmol/L — ABNORMAL LOW (ref 3.5–5.1)
Sodium: 136 mmol/L (ref 135–145)
Total Bilirubin: 0.5 mg/dL (ref 0.3–1.2)
Total Protein: 7.7 g/dL (ref 6.5–8.1)

## 2022-08-25 LAB — CBC WITH DIFFERENTIAL/PLATELET
Abs Immature Granulocytes: 0.07 10*3/uL (ref 0.00–0.07)
Basophils Absolute: 0 10*3/uL (ref 0.0–0.1)
Basophils Relative: 0 %
Eosinophils Absolute: 0 10*3/uL (ref 0.0–0.5)
Eosinophils Relative: 0 %
HCT: 38.3 % (ref 36.0–46.0)
Hemoglobin: 13 g/dL (ref 12.0–15.0)
Immature Granulocytes: 0 %
Lymphocytes Relative: 10 %
Lymphs Abs: 1.7 10*3/uL (ref 0.7–4.0)
MCH: 29.3 pg (ref 26.0–34.0)
MCHC: 33.9 g/dL (ref 30.0–36.0)
MCV: 86.3 fL (ref 80.0–100.0)
Monocytes Absolute: 0.7 10*3/uL (ref 0.1–1.0)
Monocytes Relative: 4 %
Neutro Abs: 14.3 10*3/uL — ABNORMAL HIGH (ref 1.7–7.7)
Neutrophils Relative %: 86 %
Platelets: 212 10*3/uL (ref 150–400)
RBC: 4.44 MIL/uL (ref 3.87–5.11)
RDW: 12.7 % (ref 11.5–15.5)
WBC: 16.8 10*3/uL — ABNORMAL HIGH (ref 4.0–10.5)
nRBC: 0 % (ref 0.0–0.2)

## 2022-08-25 LAB — LIPASE, BLOOD: Lipase: 27 U/L (ref 11–51)

## 2022-08-25 LAB — PREGNANCY, URINE: Preg Test, Ur: NEGATIVE

## 2022-08-25 SURGERY — LAPAROSCOPY, DIAGNOSTIC
Anesthesia: General

## 2022-08-25 MED ORDER — BUPIVACAINE HCL (PF) 0.25 % IJ SOLN
INTRAMUSCULAR | Status: DC | PRN
  Administered 2022-08-25: 20 mL

## 2022-08-25 MED ORDER — IBUPROFEN 400 MG PO TABS
400.0000 mg | ORAL_TABLET | Freq: Once | ORAL | Status: AC
  Administered 2022-08-25: 400 mg via ORAL
  Filled 2022-08-25: qty 1

## 2022-08-25 MED ORDER — IOHEXOL 300 MG/ML  SOLN
90.0000 mL | Freq: Once | INTRAMUSCULAR | Status: AC | PRN
  Administered 2022-08-25: 90 mL via INTRAVENOUS

## 2022-08-25 MED ORDER — PROPOFOL 10 MG/ML IV BOLUS
INTRAVENOUS | Status: DC | PRN
  Administered 2022-08-25: 150 mg via INTRAVENOUS

## 2022-08-25 MED ORDER — LIDOCAINE 2% (20 MG/ML) 5 ML SYRINGE
INTRAMUSCULAR | Status: DC | PRN
  Administered 2022-08-25: 40 mg via INTRAVENOUS

## 2022-08-25 MED ORDER — ONDANSETRON HCL 4 MG/2ML IJ SOLN
INTRAMUSCULAR | Status: AC
  Filled 2022-08-25: qty 2

## 2022-08-25 MED ORDER — ROCURONIUM BROMIDE 10 MG/ML (PF) SYRINGE
PREFILLED_SYRINGE | INTRAVENOUS | Status: AC
  Filled 2022-08-25: qty 10

## 2022-08-25 MED ORDER — DEXAMETHASONE SODIUM PHOSPHATE 10 MG/ML IJ SOLN
INTRAMUSCULAR | Status: DC | PRN
  Administered 2022-08-25: 4 mg via INTRAVENOUS

## 2022-08-25 MED ORDER — MEPERIDINE HCL 50 MG/ML IJ SOLN: 6.2500 mg | INTRAMUSCULAR | Status: DC | PRN

## 2022-08-25 MED ORDER — ACETAMINOPHEN 325 MG PO TABS: 325.0000 mg | ORAL_TABLET | ORAL | Status: DC | PRN

## 2022-08-25 MED ORDER — PROPOFOL 10 MG/ML IV BOLUS
INTRAVENOUS | Status: AC
  Filled 2022-08-25: qty 20

## 2022-08-25 MED ORDER — SODIUM CHLORIDE 0.9 % IV SOLN
2.0000 g | Freq: Once | INTRAVENOUS | Status: AC
  Administered 2022-08-25: 2 g via INTRAVENOUS
  Filled 2022-08-25: qty 20

## 2022-08-25 MED ORDER — ONDANSETRON HCL 4 MG/2ML IJ SOLN
4.0000 mg | Freq: Once | INTRAMUSCULAR | Status: AC
  Administered 2022-08-25: 4 mg via INTRAVENOUS
  Filled 2022-08-25: qty 2

## 2022-08-25 MED ORDER — LACTATED RINGERS IV SOLN
INTRAVENOUS | Status: DC | PRN
  Administered 2022-08-25: 1000 mL

## 2022-08-25 MED ORDER — CELECOXIB 200 MG PO CAPS
ORAL_CAPSULE | ORAL | Status: AC
  Administered 2022-08-25: 200 mg via ORAL
  Filled 2022-08-25: qty 1

## 2022-08-25 MED ORDER — ACETAMINOPHEN 500 MG PO TABS: 1000.0000 mg | ORAL_TABLET | ORAL | Status: AC

## 2022-08-25 MED ORDER — EPHEDRINE SULFATE-NACL 50-0.9 MG/10ML-% IV SOSY
PREFILLED_SYRINGE | INTRAVENOUS | Status: DC | PRN
  Administered 2022-08-25: 5 mg via INTRAVENOUS

## 2022-08-25 MED ORDER — MIDAZOLAM HCL 2 MG/2ML IJ SOLN
INTRAMUSCULAR | Status: DC | PRN
  Administered 2022-08-25: 2 mg via INTRAVENOUS

## 2022-08-25 MED ORDER — KETOROLAC TROMETHAMINE 30 MG/ML IJ SOLN
INTRAMUSCULAR | Status: AC
  Filled 2022-08-25: qty 1

## 2022-08-25 MED ORDER — SUGAMMADEX SODIUM 200 MG/2ML IV SOLN
INTRAVENOUS | Status: DC | PRN
  Administered 2022-08-25: 100 mg via INTRAVENOUS

## 2022-08-25 MED ORDER — IOHEXOL 9 MG/ML PO SOLN
500.0000 mL | ORAL | Status: AC
  Administered 2022-08-25 (×2): 500 mL via ORAL

## 2022-08-25 MED ORDER — FENTANYL CITRATE (PF) 100 MCG/2ML IJ SOLN
INTRAMUSCULAR | Status: AC
  Filled 2022-08-25: qty 2

## 2022-08-25 MED ORDER — FENTANYL CITRATE PF 50 MCG/ML IJ SOSY: 50.0000 ug | PREFILLED_SYRINGE | Freq: Once | INTRAMUSCULAR | Status: DC | PRN

## 2022-08-25 MED ORDER — CHLORHEXIDINE GLUCONATE 0.12 % MT SOLN
15.0000 mL | Freq: Once | OROMUCOSAL | Status: AC
  Administered 2022-08-25: 15 mL via OROMUCOSAL

## 2022-08-25 MED ORDER — ACETAMINOPHEN 160 MG/5ML PO SOLN: 325.0000 mg | ORAL | Status: DC | PRN

## 2022-08-25 MED ORDER — KETOROLAC TROMETHAMINE 30 MG/ML IJ SOLN
30.0000 mg | Freq: Once | INTRAMUSCULAR | Status: AC | PRN
  Administered 2022-08-25: 30 mg via INTRAVENOUS

## 2022-08-25 MED ORDER — KETAMINE HCL 50 MG/5ML IJ SOSY
PREFILLED_SYRINGE | INTRAMUSCULAR | Status: AC
  Filled 2022-08-25: qty 5

## 2022-08-25 MED ORDER — DEXAMETHASONE SODIUM PHOSPHATE 10 MG/ML IJ SOLN
INTRAMUSCULAR | Status: AC
  Filled 2022-08-25: qty 1

## 2022-08-25 MED ORDER — CELECOXIB 200 MG PO CAPS: 200.0000 mg | ORAL_CAPSULE | ORAL | Status: AC

## 2022-08-25 MED ORDER — FENTANYL CITRATE (PF) 100 MCG/2ML IJ SOLN
INTRAMUSCULAR | Status: DC | PRN
  Administered 2022-08-25 (×2): 50 ug via INTRAVENOUS

## 2022-08-25 MED ORDER — ROCURONIUM BROMIDE 10 MG/ML (PF) SYRINGE
PREFILLED_SYRINGE | INTRAVENOUS | Status: DC | PRN
  Administered 2022-08-25: 30 mg via INTRAVENOUS

## 2022-08-25 MED ORDER — LACTATED RINGERS IV SOLN: INTRAVENOUS | Status: DC

## 2022-08-25 MED ORDER — FENTANYL CITRATE PF 50 MCG/ML IJ SOSY
50.0000 ug | PREFILLED_SYRINGE | Freq: Once | INTRAMUSCULAR | Status: DC
  Filled 2022-08-25: qty 1

## 2022-08-25 MED ORDER — ONDANSETRON HCL 4 MG/2ML IJ SOLN
INTRAMUSCULAR | Status: DC | PRN
  Administered 2022-08-25: 4 mg via INTRAVENOUS

## 2022-08-25 MED ORDER — FENTANYL CITRATE PF 50 MCG/ML IJ SOSY: 25.0000 ug | PREFILLED_SYRINGE | INTRAMUSCULAR | Status: DC | PRN

## 2022-08-25 MED ORDER — SODIUM CHLORIDE 0.9 % IV BOLUS
1000.0000 mL | Freq: Once | INTRAVENOUS | Status: AC
  Administered 2022-08-25: 1000 mL via INTRAVENOUS

## 2022-08-25 MED ORDER — KETAMINE HCL 10 MG/ML IJ SOLN
INTRAMUSCULAR | Status: DC | PRN
  Administered 2022-08-25: 20 mg via INTRAVENOUS

## 2022-08-25 MED ORDER — OXYCODONE HCL 5 MG PO TABS: 5.0000 mg | ORAL_TABLET | Freq: Once | ORAL | Status: DC | PRN

## 2022-08-25 MED ORDER — OXYCODONE HCL 5 MG/5ML PO SOLN: 5.0000 mg | Freq: Once | ORAL | Status: DC | PRN

## 2022-08-25 MED ORDER — METRONIDAZOLE 500 MG/100ML IV SOLN
500.0000 mg | Freq: Once | INTRAVENOUS | Status: AC
  Administered 2022-08-25: 500 mg via INTRAVENOUS
  Filled 2022-08-25: qty 100

## 2022-08-25 MED ORDER — ACETAMINOPHEN 500 MG PO TABS
ORAL_TABLET | ORAL | Status: AC
  Administered 2022-08-25: 1000 mg via ORAL
  Filled 2022-08-25: qty 2

## 2022-08-25 MED ORDER — MIDAZOLAM HCL 2 MG/2ML IJ SOLN
INTRAMUSCULAR | Status: AC
  Filled 2022-08-25: qty 2

## 2022-08-25 MED ORDER — ORAL CARE MOUTH RINSE: 15.0000 mL | Freq: Once | OROMUCOSAL | Status: AC

## 2022-08-25 MED ORDER — ONDANSETRON HCL 4 MG/2ML IJ SOLN
4.0000 mg | Freq: Once | INTRAMUSCULAR | Status: AC | PRN
  Administered 2022-08-25: 4 mg via INTRAVENOUS

## 2022-08-25 SURGICAL SUPPLY — 33 items
ANTIFOG SOL W/FOAM PAD STRL (MISCELLANEOUS) ×1
APL PRP STRL LF DISP 70% ISPRP (MISCELLANEOUS) ×1
BAG COUNTER SPONGE SURGICOUNT (BAG) IMPLANT
BAG SPNG CNTER NS LX DISP (BAG)
CHLORAPREP W/TINT 26 (MISCELLANEOUS) ×1 IMPLANT
COVER SURGICAL LIGHT HANDLE (MISCELLANEOUS) ×1 IMPLANT
CUTTER FLEX LINEAR 45M (STAPLE) IMPLANT
ELECT REM PT RETURN 15FT ADLT (MISCELLANEOUS) ×1 IMPLANT
GLOVE BIOGEL PI IND STRL 7.0 (GLOVE) ×1 IMPLANT
GLOVE SURG ORTHO 8.0 STRL STRW (GLOVE) ×1 IMPLANT
GLOVE SURG SYN 7.5 E (GLOVE) ×1 IMPLANT
GLOVE SURG SYN 7.5 PF PI (GLOVE) ×1 IMPLANT
GOWN STRL REUS W/ TWL XL LVL3 (GOWN DISPOSABLE) ×2 IMPLANT
GOWN STRL REUS W/TWL XL LVL3 (GOWN DISPOSABLE) ×2
IRRIG SUCT STRYKERFLOW 2 WTIP (MISCELLANEOUS)
IRRIGATION SUCT STRKRFLW 2 WTP (MISCELLANEOUS) IMPLANT
KIT BASIN OR (CUSTOM PROCEDURE TRAY) ×1 IMPLANT
KIT TURNOVER KIT A (KITS) IMPLANT
RELOAD STAPLE 45 3.5 BLU ETS (ENDOMECHANICALS) IMPLANT
RELOAD STAPLE TA45 3.5 REG BLU (ENDOMECHANICALS) ×1 IMPLANT
SET TUBE SMOKE EVAC HIGH FLOW (TUBING) ×1 IMPLANT
SHEARS HARMONIC ACE PLUS 36CM (ENDOMECHANICALS) IMPLANT
SOLUTION ANTFG W/FOAM PAD STRL (MISCELLANEOUS) ×1 IMPLANT
SPIKE FLUID TRANSFER (MISCELLANEOUS) IMPLANT
STRIP CLOSURE SKIN 1/2X4 (GAUZE/BANDAGES/DRESSINGS) IMPLANT
SUT VIC AB 4-0 PS2 27 (SUTURE) IMPLANT
TOWEL OR 17X26 10 PK STRL BLUE (TOWEL DISPOSABLE) ×1 IMPLANT
TRAY FOLEY MTR SLVR 16FR STAT (SET/KITS/TRAYS/PACK) IMPLANT
TRAY LAPAROSCOPIC (CUSTOM PROCEDURE TRAY) ×1 IMPLANT
TROCAR 11X100 Z THREAD (TROCAR) IMPLANT
TROCAR BALLN 12MMX100 BLUNT (TROCAR) ×1 IMPLANT
TROCAR Z-THREAD SLEEVE 11X100 (TROCAR) IMPLANT
WATER STERILE IRR 1000ML POUR (IV SOLUTION) ×1 IMPLANT

## 2022-08-25 NOTE — ED Provider Notes (Signed)
[facility] Provider Note: [name], MD, FACEP  CSN: [id number] MRN: [id number] ARRIVAL: 08/25/22 at 0241 ROOM: MH10/MH10   CHIEF COMPLAINT  Abdominal Pain   HISTORY OF PRESENT ILLNESS  08/25/22 4:04 AM Loretta Gomez is a 27 y.o. female who developed diarrhea yesterday morning that resolved.  Yesterday evening about 8 PM she had a return of diarrhea with associated nausea, vomiting and abdominal pain.  She has difficulty characterizing the abdominal pain but she rates it as a 9 out of 10.  It is worse lying supine and better when standing up.   Past Medical History:  Diagnosis Date   History of chicken pox     Past Surgical History:  Procedure Laterality Date   HERNIA REPAIR N/A 2010   right inguinal     Family History  Problem Relation Age of Onset   Mental illness Maternal Grandmother    Cancer Maternal Grandfather    Heart disease Maternal Grandfather    Hypertension Maternal Aunt    Diabetes Paternal Uncle     Social History   Tobacco Use   Smoking status: Never   Smokeless tobacco: Never   Tobacco comments:    once a month  Vaping Use   Vaping Use: Some days   Substances: CBD, Flavoring  Substance Use Topics   Alcohol use: Not Currently   Drug use: Not Currently    Types: Marijuana    Comment: 2-3 times a month    Prior to Admission medications   Not on File    Allergies Diflucan [fluconazole] and Lobster [shellfish allergy]   REVIEW OF SYSTEMS  Negative except as noted here or in the History of Present Illness.   PHYSICAL EXAMINATION  Initial Vital Signs Blood pressure 116/74, pulse (!) 102, temperature 98.2 F (36.8 C), temperature source Oral, resp. rate 19, height 5\' 3"  (1.6 m), weight 40.8 kg, SpO2 100 %.  Examination General: Well-developed, cachectic female in no acute distress; appearance consistent with age of record HENT: normocephalic; atraumatic Eyes: Normal appearance Neck: supple Heart: regular  rate and rhythm Lungs: clear to auscultation bilaterally Abdomen: soft; nondistended; focal right lower quadrant tenderness; bowel sounds present Extremities: No deformity; full range of motion Neurologic: Awake, alert and oriented; motor function intact in all extremities and symmetric; no facial droop Skin: Warm and dry Psychiatric: Normal mood and affect  Note: Patient refused to lie down for exam, examination performed with patient standing.  RESULTS  Summary of this visit's results, reviewed and interpreted by myself:   EKG Interpretation  Date/Time:    Ventricular Rate:    PR Interval:    QRS Duration:   QT Interval:    QTC Calculation:   R Axis:     Text Interpretation:         Laboratory Studies: Results for orders placed or performed during the hospital encounter of 08/25/22 (from the past 24 hour(s))  Lipase, blood     Status: None   Collection Time: 08/25/22  2:59 AM  Result Value Ref Range   Lipase 27 11 - 51 U/L  Comprehensive metabolic panel     Status: Abnormal   Collection Time: 08/25/22  2:59 AM  Result Value Ref Range   Sodium 136 135 - 145 mmol/L   Potassium 3.3 (L) 3.5 - 5.1 mmol/L   Chloride 103 98 - 111 mmol/L   CO2 23 22 - 32 mmol/L   Glucose, Bld 134 (H) 70 - 99 mg/dL   BUN 11  6 - 20 mg/dL   Creatinine, Ser 8.65 0.44 - 1.00 mg/dL   Calcium 9.1 8.9 - 78.4 mg/dL   Total Protein 7.7 6.5 - 8.1 g/dL   Albumin 4.6 3.5 - 5.0 g/dL   AST 19 15 - 41 U/L   ALT 12 0 - 44 U/L   Alkaline Phosphatase 40 38 - 126 U/L   Total Bilirubin 0.5 0.3 - 1.2 mg/dL   GFR, Estimated >69 >62 mL/min   Anion gap 10 5 - 15  CBC with Differential     Status: Abnormal   Collection Time: 08/25/22  3:01 AM  Result Value Ref Range   WBC 16.8 (H) 4.0 - 10.5 K/uL   RBC 4.44 3.87 - 5.11 MIL/uL   Hemoglobin 13.0 12.0 - 15.0 g/dL   HCT 95.2 84.1 - 32.4 %   MCV 86.3 80.0 - 100.0 fL   MCH 29.3 26.0 - 34.0 pg   MCHC 33.9 30.0 - 36.0 g/dL   RDW 40.1 02.7 - 25.3 %   Platelets  212 150 - 400 K/uL   nRBC 0.0 0.0 - 0.2 %   Neutrophils Relative % 86 %   Neutro Abs 14.3 (H) 1.7 - 7.7 K/uL   Lymphocytes Relative 10 %   Lymphs Abs 1.7 0.7 - 4.0 K/uL   Monocytes Relative 4 %   Monocytes Absolute 0.7 0.1 - 1.0 K/uL   Eosinophils Relative 0 %   Eosinophils Absolute 0.0 0.0 - 0.5 K/uL   Basophils Relative 0 %   Basophils Absolute 0.0 0.0 - 0.1 K/uL   Immature Granulocytes 0 %   Abs Immature Granulocytes 0.07 0.00 - 0.07 K/uL  Urinalysis, Routine w reflex microscopic -Urine, Clean Catch     Status: Abnormal   Collection Time: 08/25/22  4:31 AM  Result Value Ref Range   Color, Urine YELLOW YELLOW   APPearance CLEAR CLEAR   Specific Gravity, Urine 1.025 1.005 - 1.030   pH 7.0 5.0 - 8.0   Glucose, UA NEGATIVE NEGATIVE mg/dL   Hgb urine dipstick TRACE (A) NEGATIVE   Bilirubin Urine NEGATIVE NEGATIVE   Ketones, ur >=80 (A) NEGATIVE mg/dL   Protein, ur NEGATIVE NEGATIVE mg/dL   Nitrite NEGATIVE NEGATIVE   Leukocytes,Ua NEGATIVE NEGATIVE  Pregnancy, urine     Status: None   Collection Time: 08/25/22  4:31 AM  Result Value Ref Range   Preg Test, Ur NEGATIVE NEGATIVE  Urinalysis, Microscopic (reflex)     Status: Abnormal   Collection Time: 08/25/22  4:31 AM  Result Value Ref Range   RBC / HPF 0-5 0 - 5 RBC/hpf   WBC, UA 0-5 0 - 5 WBC/hpf   Bacteria, UA RARE (A) NONE SEEN   Squamous Epithelial / HPF 0-5 0 - 5 /HPF   Mucus PRESENT    Imaging Studies: CT ABDOMEN PELVIS W CONTRAST  Result Date: 08/25/2022 CLINICAL DATA:  Right lower quadrant abdominal pain. Prior history of inguinal hernia repair. EXAM: CT ABDOMEN AND PELVIS WITH CONTRAST TECHNIQUE: Multidetector CT imaging of the abdomen and pelvis was performed using the standard protocol following bolus administration of intravenous contrast. RADIATION DOSE REDUCTION: This exam was performed according to the departmental dose-optimization program which includes automated exposure control, adjustment of the mA and/or  kV according to patient size and/or use of iterative reconstruction technique. CONTRAST:  90mL OMNIPAQUE IOHEXOL 300 MG/ML  SOLN COMPARISON:  Prior CT scan of the abdomen and pelvis 11/08/2018 FINDINGS: Lower chest: No acute abnormality. Hepatobiliary: No focal  liver abnormality is seen. No gallstones, gallbladder wall thickening, or biliary dilatation. Pancreas: Unremarkable. No pancreatic ductal dilatation or surrounding inflammatory changes. Spleen: Normal in size without focal abnormality. Adrenals/Urinary Tract: Adrenal glands are unremarkable. Kidneys are normal, without renal calculi, focal lesion, or hydronephrosis. Bladder is unremarkable. Stomach/Bowel: Stomach is within normal limits. The base of the appendix is ill-defined and edematous in appearance. There is slight hyperenhancement of the mucosa. Appendix is mildly dilated at 8 mm. Perhaps trace Peri appendiceal inflammatory stranding although very subtle. No evidence of bowel wall thickening, distention, or inflammatory changes. Vascular/Lymphatic: No significant vascular findings are present. No enlarged abdominal or pelvic lymph nodes. Reproductive: Uterus and bilateral adnexa are unremarkable. Other: No abdominal wall hernia or abnormality. Trace free fluid in the pelvis is normal in a reproductive age female. Musculoskeletal: No acute or significant osseous findings. IMPRESSION: 1. The base and proximal aspect of the fluid-filled appendix are somewhat ill-defined with slight hyperenhancement of the appendiceal mucosa. Findings suggest edema and early inflammation and are concerning for early acute appendicitis. No appendicoliths visualized. No evidence of abscess or perforation. 2. Otherwise, negative CT scan of the abdomen and pelvis. Electronically Signed   By: Malachy Moan M.D.   On: 08/25/2022 07:09    ED COURSE and MDM  Nursing notes, initial and subsequent vitals signs, including pulse oximetry, reviewed and interpreted by  myself.  Vitals:   08/25/22 1600 08/25/22 1630 08/25/22 1645 08/25/22 1700  BP:  103/67 111/62   Pulse:  77 74 85  Resp:  20    Temp:  97.8 F (36.6 C)    TempSrc:  Oral    SpO2: 98% 100% 100% 100%  Weight:      Height:       4:41 AM Patient refused fentanyl because she does not want any IV narcotics.  She also refused IV Toradol, insisting on oral ibuprofen.  7:00 AM Signed out to Dr. Lynelle Doctor. CT results pending. Differential diagnosis includes gastroenteritis, appendicitis, ovarian pathology.   PROCEDURES  Procedures   ED DIAGNOSES     ICD-10-CM   1. Acute appendicitis, unspecified acute appendicitis type  K35.80          Casmira Cramer, Jonny Ruiz, MD 08/25/22 2235

## 2022-08-25 NOTE — ED Provider Notes (Signed)
Discussed case with general surgery, Kelly.  Will plan on transfer to [facility] pre op area.   Linwood Dibbles, MD 08/25/22 [phone]

## 2022-08-25 NOTE — Op Note (Signed)
OPERATIVE REPORT - LAPAROSCOPIC APPENDECTOMY  Preop diagnosis:  Acute appendicitis  Postop diagnosis:  same  Procedure:  Laparoscopic appendectomy  Surgeon:  Darnell Level, MD  Assistant:  Josepha Pigg, MD ([facility] Resident)  Anesthesia:  general endotracheal  Estimated blood loss:  minimal  Preparation:  Chlora-prep  Complications:  none  Indications:  27 y.o. female with no medical history who presented to med [facility] ED with diarrhea, nausea, vomiting, abdominal pain.  Symptoms began yesterday morning as diarrhea then as the day progressed she developed nausea, vomiting and abdominal pain more in her RLQ and suprapubic area.  She has experienced pain episodes like this before she thinks.  She denies fever, chills, hematochezia, melena, urinary or respiratory symptoms. Workup in the ED significant for CT scan showing findings concerning for early acute appendicitis.  General surgery consulted and patient transferred to [facility] for further evaluation and surgical management.  Procedure:  Patient was brought to the operating room and placed in a supine position on the operating room table. Following administration of general anesthesia, a time out was held and the patient's name and procedure was confirmed. Patient was then prepped and draped in the usual strict aseptic fashion.  After ascertaining that an adequate level of anesthesia had been achieved, a peri-umbilical incision was made with a #15 blade. Dissection was carried down to the fascia. Fascia was incised in the midline and the peritoneal cavity was entered cautiously. A #0-vicryl pursestring suture was placed in the fascia. An Hassan cannula was introduced under direct vision and secured with the pursestring suture. The abdomen was insufflated with carbon dioxide. The laparoscope was introduced and the abdomen was explored. Operative ports were placed in the right upper quadrant and left lower  quadrant.  An orogastric tube was placed to decompress the stomach.  Exploration revealed a relatively floppy cecum.  The appendix was identified.  It appeared to be dilated and inflamed in the midportion of the appendix.  The base of the appendix appeared relatively broad as it joined the cecal wall.  The tip of the appendix appeared relatively normal.  The appendix was elevated using a Glassman clamp and the mesoappendix was taken down carefully using the harmonic scalpel.  Dissection was carried down to the base of the appendix.  The base was then transected with a Endo GIA stapler.  Staple line was inspected for hemostasis.  Loose staples were retrieved.  The appendix was placed into an endo-catch bag and withdrawn through the umbilical port. The #0-vicryl pursestring suture was tied securely.  Right lower quadrant was irrigated with warm saline which was evacuated. Good hemostasis was noted. Ports were removed under direct vision. Good hemostasis was noted at the port sites. Pneumoperitoneum was released.  Skin incisions were anesthetized with local anesthetic. Wounds were closed with interrupted 4-0 Monocryl subcuticular sutures. Wounds were washed and dried and Dermabond was applied. The patient was awakened from anesthesia and brought to the recovery room. The patient tolerated the procedure well.  Darnell Level, MD [facility] Surgery Office: [phone]

## 2022-08-25 NOTE — Interval H&P Note (Signed)
History and Physical Interval Note:  08/25/2022 12:17 PM  Loretta Gomez  has presented today for surgery, with the diagnosis of APPENDICITIS.  The various methods of treatment have been discussed with the patient and family. After consideration of risks, benefits and other options for treatment, the patient has consented to    Procedure(s): LAPAROSCOPY DIAGNOSTIC (N/A) as a surgical intervention.    The patient's history has been reviewed, patient examined, no change in status, stable for surgery.  I have reviewed the patient's chart and labs.  Questions were answered to the patient's satisfaction.    Darnell Level, MD [facility] Surgery A [facility] practice Office: [phone]   Darnell Level

## 2022-08-25 NOTE — Anesthesia Procedure Notes (Signed)
Procedure Name: Intubation Date/Time: 08/25/2022 12:57 PM  Performed by: Sindy Guadeloupe, CRNAPre-anesthesia Checklist: Patient identified, Emergency Drugs available, Suction available, Patient being monitored and Timeout performed Patient Re-evaluated:Patient Re-evaluated prior to induction Oxygen Delivery Method: Circle system utilized Preoxygenation: Pre-oxygenation with 100% oxygen Induction Type: IV induction Ventilation: Mask ventilation without difficulty Laryngoscope Size: Mac and 3 Grade View: Grade I Tube type: Oral Tube size: 7.0 mm Number of attempts: 1 Airway Equipment and Method: Stylet Placement Confirmation: ETT inserted through vocal cords under direct vision, positive ETCO2 and breath sounds checked- equal and bilateral Secured at: 21 cm Tube secured with: Tape Dental Injury: Teeth and Oropharynx as per pre-operative assessment

## 2022-08-25 NOTE — H&P (Signed)
Admission Note  Loretta Gomez 1995-05-21  [id number].    Chief Complaint/Reason for Consult: acute appendicitis  HPI:  27 y.o. female with no medical history who presented to med [facility] ED with diarrhea, nausea, vomiting, abdominal pain.  Symptoms began yesterday morning as diarrhea then as the day progressed she developed nausea, vomiting and abdominal pain more in her RLQ and suprapubic area.  She has experienced pain episodes like this before she thinks.  She denies fever, chills, hematochezia, melena, urinary or respiratory symptoms.  Workup in the ED significant for CT scan showing findings concerning for early acute appendicitis.  General surgery consulted and patient transferred to [facility] [place] for further evaluation and surgical management.   Substance use: Denies alcohol use, quite smoking several years ago Allergies: Diflucan, shellfish Blood thinners: None Past Surgeries: Right inguinal hernia repair 2010   ROS: ROS reviewed and as above  Family History  Problem Relation Age of Onset   Mental illness Maternal Grandmother    Cancer Maternal Grandfather    Heart disease Maternal Grandfather    Hypertension Maternal Aunt    Diabetes Paternal Uncle     Past Medical History:  Diagnosis Date   History of chicken pox     Past Surgical History:  Procedure Laterality Date   HERNIA REPAIR N/A 2010   right inguinal     Social History:  reports that she has never smoked. She has never used smokeless tobacco. She reports that she does not currently use alcohol. She reports that she does not currently use drugs after having used the following drugs: Marijuana.  Allergies:  Allergies  Allergen Reactions   Diflucan [Fluconazole] Shortness Of Breath and Other (See Comments)    Stomach pain   Lobster [Shellfish Allergy]     Lobster only per pt. Itching in and around mouth.      (Not in a hospital admission)   Blood pressure  118/72, pulse 95, temperature 98.6 F (37 C), resp. rate 16, height 5\' 3"  (1.6 m), weight 40.8 kg, SpO2 100 %. Physical Exam: General: pleasant, WD, female who is laying in bed in NAD HEENT: head is normocephalic, atraumatic.  Sclera are noninjected.  Pupils equal and round. EOMs intact.  Ears and nose without any masses or lesions.  Mouth is pink and moist Heart: regular, rate, and rhythm.  Normal s1,s2. No obvious murmurs, gallops, or rubs noted.  Palpable radial and pedal pulses bilaterally Lungs: CTAB, no wheezes, rhonchi, or rales noted.  Respiratory effort nonlabored Abd: soft, tender in RLQ and suprapubic region that correlated with appendix on CT scan, ND, +BS, no masses, hernias, or organomegaly MSK: all 4 extremities are symmetrical with no cyanosis, clubbing, or edema. Psych: A&Ox3 with an appropriate affect.    Results for orders placed or performed during the hospital encounter of 08/25/22 (from the past 48 hour(s))  Lipase, blood     Status: None   Collection Time: 08/25/22  2:59 AM  Result Value Ref Range   Lipase 27 11 - 51 U/L    Comment: Performed at [facility], [address] [place], [place] [address]  Comprehensive metabolic panel     Status: Abnormal   Collection Time: 08/25/22  2:59 AM  Result Value Ref Range   Sodium 136 135 - 145 mmol/L   Potassium 3.3 (L) 3.5 - 5.1 mmol/L   Chloride 103 98 - 111 mmol/L   CO2 23 22 - 32 mmol/L  Glucose, Bld 134 (H) 70 - 99 mg/dL    Comment: Glucose reference range applies only to samples taken after fasting for at least 8 hours.   BUN 11 6 - 20 mg/dL   Creatinine, Ser 4.09 0.44 - 1.00 mg/dL   Calcium 9.1 8.9 - 81.1 mg/dL   Total Protein 7.7 6.5 - 8.1 g/dL   Albumin 4.6 3.5 - 5.0 g/dL   AST 19 15 - 41 U/L   ALT 12 0 - 44 U/L   Alkaline Phosphatase 40 38 - 126 U/L   Total Bilirubin 0.5 0.3 - 1.2 mg/dL   GFR, Estimated >91 >47 mL/min    Comment: (NOTE) Calculated using the CKD-EPI Creatinine Equation (2021)     Anion gap 10 5 - 15    Comment: Performed at [facility], [address] [place], [place] [address]  CBC with Differential     Status: Abnormal   Collection Time: 08/25/22  3:01 AM  Result Value Ref Range   WBC 16.8 (H) 4.0 - 10.5 K/uL   RBC 4.44 3.87 - 5.11 MIL/uL   Hemoglobin 13.0 12.0 - 15.0 g/dL   HCT 21.3 08.6 - 57.8 %   MCV 86.3 80.0 - 100.0 fL   MCH 29.3 26.0 - 34.0 pg   MCHC 33.9 30.0 - 36.0 g/dL   RDW 46.9 62.9 - 52.8 %   Platelets 212 150 - 400 K/uL   nRBC 0.0 0.0 - 0.2 %   Neutrophils Relative % 86 %   Neutro Abs 14.3 (H) 1.7 - 7.7 K/uL   Lymphocytes Relative 10 %   Lymphs Abs 1.7 0.7 - 4.0 K/uL   Monocytes Relative 4 %   Monocytes Absolute 0.7 0.1 - 1.0 K/uL   Eosinophils Relative 0 %   Eosinophils Absolute 0.0 0.0 - 0.5 K/uL   Basophils Relative 0 %   Basophils Absolute 0.0 0.0 - 0.1 K/uL   Immature Granulocytes 0 %   Abs Immature Granulocytes 0.07 0.00 - 0.07 K/uL    Comment: Performed at [facility], [address] [place], [place] [address]  Urinalysis, Routine w reflex microscopic -Urine, Clean Catch     Status: Abnormal   Collection Time: 08/25/22  4:31 AM  Result Value Ref Range   Color, Urine YELLOW YELLOW   APPearance CLEAR CLEAR   Specific Gravity, Urine 1.025 1.005 - 1.030   pH 7.0 5.0 - 8.0   Glucose, UA NEGATIVE NEGATIVE mg/dL   Hgb urine dipstick TRACE (A) NEGATIVE   Bilirubin Urine NEGATIVE NEGATIVE   Ketones, ur >=80 (A) NEGATIVE mg/dL   Protein, ur NEGATIVE NEGATIVE mg/dL   Nitrite NEGATIVE NEGATIVE   Leukocytes,Ua NEGATIVE NEGATIVE    Comment: Performed at [facility], [address] [place], [place] [address]  Pregnancy, urine     Status: None   Collection Time: 08/25/22  4:31 AM  Result Value Ref Range   Preg Test, Ur NEGATIVE NEGATIVE    Comment:        THE SENSITIVITY OF THIS METHODOLOGY IS >25 mIU/mL. Performed at [facility], [address] [place], [place] [address]    Urinalysis, Microscopic (reflex)     Status: Abnormal   Collection Time: 08/25/22  4:31 AM  Result Value Ref Range   RBC / HPF 0-5 0 - 5 RBC/hpf   WBC, UA 0-5 0 - 5 WBC/hpf   Bacteria, UA RARE (A) NONE SEEN  Squamous Epithelial / HPF 0-5 0 - 5 /HPF   Mucus PRESENT     Comment: Performed at [facility], [address] [place], [place] [address]   CT ABDOMEN PELVIS W CONTRAST  Result Date: 08/25/2022 CLINICAL DATA:  Right lower quadrant abdominal pain. Prior history of inguinal hernia repair. EXAM: CT ABDOMEN AND PELVIS WITH CONTRAST TECHNIQUE: Multidetector CT imaging of the abdomen and pelvis was performed using the standard protocol following bolus administration of intravenous contrast. RADIATION DOSE REDUCTION: This exam was performed according to the departmental dose-optimization program which includes automated exposure control, adjustment of the mA and/or kV according to patient size and/or use of iterative reconstruction technique. CONTRAST:  90mL OMNIPAQUE IOHEXOL 300 MG/ML  SOLN COMPARISON:  Prior CT scan of the abdomen and pelvis 11/08/2018 FINDINGS: Lower chest: No acute abnormality. Hepatobiliary: No focal liver abnormality is seen. No gallstones, gallbladder wall thickening, or biliary dilatation. Pancreas: Unremarkable. No pancreatic ductal dilatation or surrounding inflammatory changes. Spleen: Normal in size without focal abnormality. Adrenals/Urinary Tract: Adrenal glands are unremarkable. Kidneys are normal, without renal calculi, focal lesion, or hydronephrosis. Bladder is unremarkable. Stomach/Bowel: Stomach is within normal limits. The base of the appendix is ill-defined and edematous in appearance. There is slight hyperenhancement of the mucosa. Appendix is mildly dilated at 8 mm. Perhaps trace Peri appendiceal inflammatory stranding although very subtle. No evidence of bowel wall thickening, distention, or inflammatory changes. Vascular/Lymphatic: No significant  vascular findings are present. No enlarged abdominal or pelvic lymph nodes. Reproductive: Uterus and bilateral adnexa are unremarkable. Other: No abdominal wall hernia or abnormality. Trace free fluid in the pelvis is normal in a reproductive age female. Musculoskeletal: No acute or significant osseous findings. IMPRESSION: 1. The base and proximal aspect of the fluid-filled appendix are somewhat ill-defined with slight hyperenhancement of the appendiceal mucosa. Findings suggest edema and early inflammation and are concerning for early acute appendicitis. No appendicoliths visualized. No evidence of abscess or perforation. 2. Otherwise, negative CT scan of the abdomen and pelvis. Electronically Signed   By: Malachy Moan M.D.   On: 08/25/2022 07:09      Assessment/Plan Acute appendicitis  Patient seen and examined and relevant labs and imaging reviewed.  Workup and exam/history consistent with early acute appendicitis.  Discussed treatment options with patient and recommend definitive management with laparoscopic appendectomy.  I have discussed the procedure and risks of appendectomy. The risks include but are not limited to bleeding, infection, wound problems, anesthesia, injury to intra-abdominal organs, possibility of postoperative ileus. She seems to understand and agrees with the plan.  We will plan for DC from PACU if surgery goes well.  She is adamant that she does not want any narcotics or even Robaxin at home.  She will take Tylenol and Ibuprofen at home.   FEN: N.p.o. for OR ID: Rocephin/Flagyl VTE: None currently  Dispo: To the OR today for laparoscopic appendectomy, possible discharge from PACU   I reviewed ED provider notes, last 24 h vitals and pain scores, last 48 h intake and output, last 24 h labs and trends, and last 24 h imaging results.   Letha Cape, [facility] Surgery 08/25/2022, 8:19 AM Please see Amion for pager number during day hours  7:00am-4:30pm

## 2022-08-25 NOTE — Discharge Instructions (Signed)
[facility] ° °Please arrive at least 30 min before your appointment to complete your check in paperwork.  If you are unable to arrive 30 min prior to your appointment time we may have to cancel or reschedule you. °LAPAROSCOPIC SURGERY: POST OP INSTRUCTIONS °Always review your discharge instruction sheet given to you by the facility where your surgery was performed. °IF YOU HAVE DISABILITY OR FAMILY LEAVE FORMS, YOU MUST BRING THEM TO THE OFFICE FOR PROCESSING.   °DO NOT GIVE THEM TO YOUR DOCTOR. ° °PAIN CONTROL ° °First take acetaminophen (Tylenol) AND/or ibuprofen (Advil) to control your pain after surgery.  Follow directions on package.  Taking acetaminophen (Tylenol) and/or ibuprofen (Advil) regularly after surgery will help to control your pain and lower the amount of prescription pain medication you may need.  You should not take more than 4,000 mg (4 grams) of acetaminophen (Tylenol) in 24 hours.  You should not take ibuprofen (Advil), aleve, motrin, naprosyn or other NSAIDS if you have a history of stomach ulcers or chronic kidney disease.  °A prescription for pain medication may be given to you upon discharge.  Take your pain medication as prescribed, if you still have uncontrolled pain after taking acetaminophen (Tylenol) or ibuprofen (Advil). °Use ice packs to help control pain. °If you need a refill on your pain medication, please contact your pharmacy.  They will contact our office to request authorization. Prescriptions will not be filled after 5pm or on week-ends. ° °HOME MEDICATIONS °Take your usually prescribed medications unless otherwise directed. ° °DIET °You should follow a light diet the first few days after arrival home.  Be sure to include lots of fluids daily. Avoid fatty, fried foods.  ° °CONSTIPATION °It is common to experience some constipation after surgery and if you are taking pain medication.  Increasing fluid intake and taking a stool softener (such as Colace)  will usually help or prevent this problem from occurring.  A mild laxative (Milk of Magnesia or Miralax) should be taken according to package instructions if there are no bowel movements after 48 hours. ° °WOUND/INCISION CARE °Most patients will experience some swelling and bruising in the area of the incisions.  Ice packs will help.  Swelling and bruising can take several days to resolve.  °Unless discharge instructions indicate otherwise, follow guidelines below  °STERI-STRIPS - you may remove your outer bandages 48 hours after surgery, and you may shower at that time.  You have steri-strips (small skin tapes) in place directly over the incision.  These strips should be left on the skin for 7-10 days.   °DERMABOND/SKIN GLUE - you may shower in 24 hours.  The glue will flake off over the next 2-3 weeks. °Any sutures or staples will be removed at the office during your follow-up visit. ° °ACTIVITIES °You may resume regular (light) daily activities beginning the next day--such as daily self-care, walking, climbing stairs--gradually increasing activities as tolerated.  You may have sexual intercourse when it is comfortable.  Refrain from any heavy lifting or straining until approved by your doctor. °You may drive when you are no longer taking prescription pain medication, you can comfortably wear a seatbelt, and you can safely maneuver your car and apply brakes. ° °FOLLOW-UP °You should see your doctor in the office for a follow-up appointment approximately 2-3 weeks after your surgery.  You should have been given your post-op/follow-up appointment when your surgery was scheduled.  If you did not receive a post-op/follow-up appointment, make sure   that you call for this appointment within a day or two after you arrive home to insure a convenient appointment time. ° ° °WHEN TO CALL YOUR DOCTOR: °Fever over 101.0 °Inability to urinate °Continued bleeding from incision. °Increased pain, redness, or drainage from the  incision. °Increasing abdominal pain ° °The clinic staff is available to answer your questions during regular business hours.  Please don’t hesitate to call and ask to speak to one of the nurses for clinical concerns.  If you have a medical emergency, go to the nearest emergency room or call 911.  A surgeon from [facility] is always on call at the hospital. °[address], [address], [place], [place]  [address] ? [address] [address], , [place]   [address] °[phone] ? [phone] ? FAX [phone] ° ° ° ° °Managing Your Pain After Surgery Without Opioids ° ° ° °Thank you for participating in our program to help patients manage their pain after surgery without opioids. This is part of our effort to provide you with the best care possible, without exposing you or your family to the risk that opioids pose. ° °What pain can I expect after surgery? °You can expect to have some pain after surgery. This is normal. The pain is typically worse the day after surgery, and quickly begins to get better. °Many studies have found that many patients are able to manage their pain after surgery with Over-the-Counter (OTC) medications such as Tylenol and Motrin. If you have a condition that does not allow you to take Tylenol or Motrin, notify your surgical team. ° °How will I manage my pain? °The best strategy for controlling your pain after surgery is around the clock pain control with Tylenol (acetaminophen) and Motrin (ibuprofen or Advil). Alternating these medications with each other allows you to maximize your pain control. In addition to Tylenol and Motrin, you can use heating pads or ice packs on your incisions to help reduce your pain. ° °How will I alternate your regular strength over-the-counter pain medication? °You will take a dose of pain medication every three hours. °Start by taking 650 mg of Tylenol (2 pills of 325 mg) °3 hours later take 600 mg of Motrin (3 pills of 200 mg) °3 hours after  taking the Motrin take 650 mg of Tylenol °3 hours after that take 600 mg of Motrin. ° ° °- 1 - ° °See example - if your first dose of Tylenol is at 12:00 PM ° ° °12:00 PM Tylenol 650 mg (2 pills of 325 mg)  °3:00 PM Motrin 600 mg (3 pills of 200 mg)  °6:00 PM Tylenol 650 mg (2 pills of 325 mg)  °9:00 PM Motrin 600 mg (3 pills of 200 mg)  °Continue alternating every 3 hours  ° °We recommend that you follow this schedule around-the-clock for at least 3 days after surgery, or until you feel that it is no longer needed. Use the table on the last page of this handout to keep track of the medications you are taking. °Important: °Do not take more than 3000mg of Tylenol or 3200mg of Motrin in a 24-hour period. °Do not take ibuprofen/Motrin if you have a history of bleeding stomach ulcers, severe kidney disease, &/or actively taking a blood thinner ° °What if I still have pain? °If you have pain that is not controlled with the over-the-counter pain medications (Tylenol and Motrin or Advil) you might have what we call “breakthrough” pain. You will receive a prescription   for a small amount of an opioid pain medication such as Oxycodone, Tramadol, or Tylenol with Codeine. Use these opioid pills in the first 24 hours after surgery if you have breakthrough pain. Do not take more than 1 pill every 4-6 hours. ° °If you still have uncontrolled pain after using all opioid pills, don't hesitate to call our staff using the number provided. We will help make sure you are managing your pain in the best way possible, and if necessary, we can provide a prescription for additional pain medication. ° ° °Day 1   ° °Time  °Name of Medication Number of pills taken  °Amount of Acetaminophen  °Pain Level  ° °Comments  °AM PM       °AM PM       °AM PM       °AM PM       °AM PM       °AM PM       °AM PM       °AM PM       °Total Daily amount of Acetaminophen °Do not take more than  3,000 mg per day    ° ° °Day 2   ° °Time  °Name of Medication  Number of pills °taken  °Amount of Acetaminophen  °Pain Level  ° °Comments  °AM PM       °AM PM       °AM PM       °AM PM       °AM PM       °AM PM       °AM PM       °AM PM       °Total Daily amount of Acetaminophen °Do not take more than  3,000 mg per day    ° ° °Day 3   ° °Time  °Name of Medication Number of pills taken  °Amount of Acetaminophen  °Pain Level  ° °Comments  °AM PM       °AM PM       °AM PM       °AM PM       ° ° ° °AM PM       °AM PM       °AM PM       °AM PM       °Total Daily amount of Acetaminophen °Do not take more than  3,000 mg per day    ° ° °Day 4   ° °Time  °Name of Medication Number of pills taken  °Amount of Acetaminophen  °Pain Level  ° °Comments  °AM PM       °AM PM       °AM PM       °AM PM       °AM PM       °AM PM       °AM PM       °AM PM       °Total Daily amount of Acetaminophen °Do not take more than  3,000 mg per day    ° ° °Day 5   ° °Time  °Name of Medication Number °of pills taken  °Amount of Acetaminophen  °Pain Level  ° °Comments  °AM PM       °AM PM       °AM PM       °AM PM       °AM PM       °AM   PM       °AM PM       °AM PM       °Total Daily amount of Acetaminophen °Do not take more than  3,000 mg per day    ° ° ° °Day 6   ° °Time  °Name of Medication Number of pills °taken  °Amount of Acetaminophen  °Pain Level  °Comments  °AM PM       °AM PM       °AM PM       °AM PM       °AM PM       °AM PM       °AM PM       °AM PM       °Total Daily amount of Acetaminophen °Do not take more than  3,000 mg per day    ° ° °Day 7   ° °Time  °Name of Medication Number of pills taken  °Amount of Acetaminophen  °Pain Level  ° °Comments  °AM PM       °AM PM       °AM PM       °AM PM       °AM PM       °AM PM       °AM PM       °AM PM       °Total Daily amount of Acetaminophen °Do not take more than  3,000 mg per day    ° ° ° ° °For additional information about how and where to safely dispose of unused opioid °medications - https://www.morepowerfulnc.org ° °Disclaimer: This document  contains information and/or instructional materials adapted from [facility] for the typical patient with your condition. It does not replace medical advice from your health care provider because your experience may differ from that of the °typical patient. Talk to your health care provider if you have any questions about this °document, your condition or your treatment plan. °Adapted from [facility] ° °

## 2022-08-25 NOTE — ED Triage Notes (Signed)
Pt states she has had ABD pain with N/V/D since approx 2000 last night.

## 2022-08-25 NOTE — Transfer of Care (Signed)
Immediate Anesthesia Transfer of Care Note  Patient: Loretta Gomez  Procedure(s) Performed: LAPAROSCOPY APPENDECTOMY  Patient Location: PACU  Anesthesia Type:General  Level of Consciousness: awake, drowsy, and patient cooperative  Airway & Oxygen Therapy: Patient Spontanous Breathing and Patient connected to face mask oxygen  Post-op Assessment: Report given to RN and Post -op Vital signs reviewed and stable  Post vital signs: Reviewed and stable  Last Vitals:  Vitals Value Taken Time  BP 119/71 08/25/22 1400  Temp    Pulse 85 08/25/22 1401  Resp 15 08/25/22 1401  SpO2 100 % 08/25/22 1401  Vitals shown include unvalidated device data.  Last Pain:  Vitals:   08/25/22 1139  TempSrc:   PainSc: 3       Patients Stated Pain Goal: 3 (08/25/22 1139)  Complications: No notable events documented.

## 2022-08-25 NOTE — Anesthesia Preprocedure Evaluation (Signed)
Anesthesia Evaluation  Patient identified by MRN, date of birth, ID band Patient awake    Reviewed: Allergy & Precautions, H&P , NPO status , Patient's Chart, lab work & pertinent test results  Airway Mallampati: I       Dental no notable dental hx.    Pulmonary neg pulmonary ROS   Pulmonary exam normal        Cardiovascular negative cardio ROS Normal cardiovascular exam     Neuro/Psych negative neurological ROS     GI/Hepatic negative GI ROS, Neg liver ROS,,,  Endo/Other  negative endocrine ROS    Renal/GU negative Renal ROS  negative genitourinary   Musculoskeletal negative musculoskeletal ROS (+)    Abdominal Normal abdominal exam  (+)   Peds negative pediatric ROS (+)  Hematology negative hematology ROS (+)   Anesthesia Other Findings   Reproductive/Obstetrics negative OB ROS                             Anesthesia Physical Anesthesia Plan  ASA: 1  Anesthesia Plan: General   Post-op Pain Management:    Induction: Intravenous  PONV Risk Score and Plan: 4 or greater and Ondansetron, Dexamethasone, Midazolam and Treatment may vary due to age or medical condition  Airway Management Planned: Oral ETT  Additional Equipment: None  Intra-op Plan:   Post-operative Plan: Extubation in OR  Informed Consent: I have reviewed the patients History and Physical, chart, labs and discussed the procedure including the risks, benefits and alternatives for the proposed anesthesia with the patient or authorized representative who has indicated his/her understanding and acceptance.     Dental advisory given  Plan Discussed with: CRNA  Anesthesia Plan Comments:        Anesthesia Quick Evaluation

## 2022-08-26 ENCOUNTER — Encounter (HOSPITAL_COMMUNITY): Payer: Self-pay | Admitting: Surgery

## 2022-08-26 LAB — SURGICAL PATHOLOGY

## 2022-08-26 NOTE — Anesthesia Postprocedure Evaluation (Signed)
Anesthesia Post Note  Patient: Loretta Gomez  Procedure(s) Performed: LAPAROSCOPY APPENDECTOMY     Anesthesia Type: General Level of consciousness: awake Pain management: pain level controlled Vital Signs Assessment: post-procedure vital signs reviewed and stable Respiratory status: spontaneous breathing Cardiovascular status: stable Anesthetic complications: yes (PONV)  No notable events documented.  Last Vitals:  Vitals:   08/25/22 1645 08/25/22 1700  BP: 111/62   Pulse: 74 85  Resp:    Temp:    SpO2: 100% 100%    Last Pain:  Vitals:   08/25/22 1645  TempSrc:   PainSc: 0-No pain                 Caren Macadam

## 2022-08-27 ENCOUNTER — Ambulatory Visit: Payer: Self-pay

## 2022-08-27 ENCOUNTER — Other Ambulatory Visit: Payer: Self-pay

## 2022-08-27 ENCOUNTER — Emergency Department (HOSPITAL_BASED_OUTPATIENT_CLINIC_OR_DEPARTMENT_OTHER)
Admission: EM | Admit: 2022-08-27 | Discharge: 2022-08-27 | Disposition: A | Discharge status: Home / Self Care | Payer: Self-pay | Attending: Emergency Medicine | Admitting: Emergency Medicine

## 2022-08-27 ENCOUNTER — Encounter (HOSPITAL_BASED_OUTPATIENT_CLINIC_OR_DEPARTMENT_OTHER): Payer: Self-pay | Admitting: Emergency Medicine

## 2022-08-27 ENCOUNTER — Telehealth: Payer: Self-pay | Admitting: Family

## 2022-08-27 ENCOUNTER — Emergency Department (HOSPITAL_BASED_OUTPATIENT_CLINIC_OR_DEPARTMENT_OTHER): Payer: Self-pay

## 2022-08-27 DIAGNOSIS — G8918 Other acute postprocedural pain: Secondary | ICD-10-CM | POA: Insufficient documentation

## 2022-08-27 DIAGNOSIS — R1084 Generalized abdominal pain: Secondary | ICD-10-CM | POA: Insufficient documentation

## 2022-08-27 LAB — CBC WITH DIFFERENTIAL/PLATELET
Abs Immature Granulocytes: 0.01 10*3/uL (ref 0.00–0.07)
Basophils Absolute: 0 10*3/uL (ref 0.0–0.1)
Basophils Relative: 0 %
Eosinophils Absolute: 0 10*3/uL (ref 0.0–0.5)
Eosinophils Relative: 1 %
HCT: 35.7 % — ABNORMAL LOW (ref 36.0–46.0)
Hemoglobin: 11.8 g/dL — ABNORMAL LOW (ref 12.0–15.0)
Immature Granulocytes: 0 %
Lymphocytes Relative: 33 %
Lymphs Abs: 1.9 10*3/uL (ref 0.7–4.0)
MCH: 29 pg (ref 26.0–34.0)
MCHC: 33.1 g/dL (ref 30.0–36.0)
MCV: 87.7 fL (ref 80.0–100.0)
Monocytes Absolute: 0.6 10*3/uL (ref 0.1–1.0)
Monocytes Relative: 10 %
Neutro Abs: 3.2 10*3/uL (ref 1.7–7.7)
Neutrophils Relative %: 56 %
Platelets: 190 10*3/uL (ref 150–400)
RBC: 4.07 MIL/uL (ref 3.87–5.11)
RDW: 12.8 % (ref 11.5–15.5)
WBC: 5.7 10*3/uL (ref 4.0–10.5)
nRBC: 0 % (ref 0.0–0.2)

## 2022-08-27 LAB — PREGNANCY, URINE: Preg Test, Ur: NEGATIVE

## 2022-08-27 LAB — COMPREHENSIVE METABOLIC PANEL
ALT: 12 U/L (ref 0–44)
AST: 16 U/L (ref 15–41)
Albumin: 4.1 g/dL (ref 3.5–5.0)
Alkaline Phosphatase: 33 U/L — ABNORMAL LOW (ref 38–126)
Anion gap: 9 (ref 5–15)
BUN: 7 mg/dL (ref 6–20)
CO2: 25 mmol/L (ref 22–32)
Calcium: 8.4 mg/dL — ABNORMAL LOW (ref 8.9–10.3)
Chloride: 102 mmol/L (ref 98–111)
Creatinine, Ser: 0.5 mg/dL (ref 0.44–1.00)
GFR, Estimated: >60 mL/min (ref >60)
Glucose, Bld: 103 mg/dL — ABNORMAL HIGH (ref 70–99)
Potassium: 3.3 mmol/L — ABNORMAL LOW (ref 3.5–5.1)
Sodium: 136 mmol/L (ref 135–145)
Total Bilirubin: 0.4 mg/dL (ref 0.3–1.2)
Total Protein: 6.8 g/dL (ref 6.5–8.1)

## 2022-08-27 LAB — URINALYSIS, ROUTINE W REFLEX MICROSCOPIC
Bilirubin Urine: NEGATIVE
Glucose, UA: NEGATIVE mg/dL
Hgb urine dipstick: NEGATIVE
Ketones, ur: 40 mg/dL — AB
Leukocytes,Ua: NEGATIVE
Nitrite: NEGATIVE
Protein, ur: NEGATIVE mg/dL
Specific Gravity, Urine: <=1.005 (ref 1.005–1.030)
pH: 6 (ref 5.0–8.0)

## 2022-08-27 LAB — LIPASE, BLOOD: Lipase: 31 U/L (ref 11–51)

## 2022-08-27 LAB — OCCULT BLOOD X 1 CARD TO LAB, STOOL: Fecal Occult Bld: NEGATIVE

## 2022-08-27 MED ORDER — POTASSIUM CHLORIDE CRYS ER 20 MEQ PO TBCR
20.0000 meq | EXTENDED_RELEASE_TABLET | Freq: Once | ORAL | Status: AC
  Administered 2022-08-27: 20 meq via ORAL
  Filled 2022-08-27: qty 1

## 2022-08-27 MED ORDER — IOHEXOL 300 MG/ML  SOLN
75.0000 mL | Freq: Once | INTRAMUSCULAR | Status: AC | PRN
  Administered 2022-08-27: 75 mL via INTRAVENOUS

## 2022-08-27 NOTE — ED Provider Notes (Cosign Needed Addendum)
[place] [facility] Provider Note   CSN: [id number] Arrival date & time: 08/27/22  2006     History  Chief Complaint  Patient presents with   GI Problem   Chills    Loretta Gomez is a 27 y.o. female status post appendectomy 08/25/2022 presented with postop abdominal pain.  Patient states abdominal pain is in the lower region and is constant sharp pain.  Patient states that she had her first bowel movement today and that that she saw black stool but denies any blood.  Patient states the stool was loose and when she called the surgeon's office she is unable to find the surgeons referred to ER.  Patient notes she is also having chills and feels that her mind is foggy.  Patient that she still has abdominal pain at the sites of her surgery but denies any fevers.  Patient denied any chest pain, shortness of breath, nausea/vomiting, change in sensation ecchymosis, new onset weakness  Home Medications Prior to Admission medications   Not on File      Allergies    Diflucan [fluconazole] and Lobster [shellfish allergy]    Review of Systems   Review of Systems  Physical Exam Updated Vital Signs BP 110/62   Pulse 81   Temp 98.3 F (36.8 C) (Oral)   Resp 16   Ht 5\' 3"  (1.6 m)   Wt 40.8 kg   LMP 08/13/2022 (Exact Date)   SpO2 99%   BMI 15.94 kg/m  Physical Exam Constitutional:      General: She is not in acute distress. Cardiovascular:     Rate and Rhythm: Normal rate and regular rhythm.     Pulses: Normal pulses.     Heart sounds: Normal heart sounds.  Pulmonary:     Effort: Pulmonary effort is normal. No respiratory distress.     Breath sounds: Normal breath sounds.  Abdominal:     Palpations: Abdomen is soft.     Tenderness: There is abdominal tenderness (generalized). There is no guarding or rebound.  Genitourinary:    Comments: Chaperone: Kelvin Cellar, RN No obvious abnormalities No gross bleeding Skin:    General: Skin  is warm and dry.     Capillary Refill: Capillary refill takes less than 2 seconds.     Comments: Laparoscopic incision sites healing well without signs of infection or discharge  Neurological:     Mental Status: She is alert.     ED Results / Procedures / Treatments   Labs (all labs ordered are listed, but only abnormal results are displayed) Labs Reviewed  CBC WITH DIFFERENTIAL/PLATELET - Abnormal; Notable for the following components:      Result Value   Hemoglobin 11.8 (*)    HCT 35.7 (*)    All other components within normal limits  COMPREHENSIVE METABOLIC PANEL - Abnormal; Notable for the following components:   Potassium 3.3 (*)    Glucose, Bld 103 (*)    Calcium 8.4 (*)    Alkaline Phosphatase 33 (*)    All other components within normal limits  URINALYSIS, ROUTINE W REFLEX MICROSCOPIC - Abnormal; Notable for the following components:   Ketones, ur 40 (*)    All other components within normal limits  LIPASE, BLOOD  OCCULT BLOOD X 1 CARD TO LAB, STOOL  PREGNANCY, URINE    EKG None  Radiology CT ABDOMEN PELVIS W CONTRAST  Result Date: 08/27/2022 CLINICAL DATA:  Recent appendectomy.  Postoperative abdominal pain. EXAM: CT ABDOMEN  AND PELVIS WITH CONTRAST TECHNIQUE: Multidetector CT imaging of the abdomen and pelvis was performed using the standard protocol following bolus administration of intravenous contrast. RADIATION DOSE REDUCTION: This exam was performed according to the departmental dose-optimization program which includes automated exposure control, adjustment of the mA and/or kV according to patient size and/or use of iterative reconstruction technique. CONTRAST:  75mL OMNIPAQUE IOHEXOL 300 MG/ML  SOLN COMPARISON:  CT abdomen and pelvis 08/25/2022 FINDINGS: Lower chest: No acute abnormality. Hepatobiliary: Small gallstones are present. There is no biliary ductal dilatation. There is a subcentimeter hypodensity in the dome of the liver which is unchanged favored as a  cyst. Pancreas: Unremarkable. No pancreatic ductal dilatation or surrounding inflammatory changes. Spleen: Normal in size without focal abnormality. Adrenals/Urinary Tract: Adrenal glands are unremarkable. Kidneys are normal, without renal calculi, focal lesion, or hydronephrosis. Bladder is unremarkable. Stomach/Bowel: Stomach is within normal limits. No evidence of bowel wall thickening, distention, or inflammatory changes. Patient is status post appendectomy. Vascular/Lymphatic: No significant vascular findings are present. No enlarged abdominal or pelvic lymph nodes. Reproductive: There is a collapsing follicle or cyst in the right ovary measuring 13 mm. The left ovary and uterus are within normal limits. Other: There is a small amount of simple free fluid in the pelvis. There is a small amount of free air in the right lower quadrant and upper abdomen compatible with recent surgery. There is no focal abdominal wall hernia. Air in the anterior abdominal wall compatible with recent surgery. Musculoskeletal: No acute or significant osseous findings. IMPRESSION: 1. Patient is status post appendectomy. There is a small amount of free air in the right lower quadrant and upper abdomen compatible with recent surgery. 2. Small amount of simple free fluid in the pelvis. 3. Cholelithiasis. 4. Collapsing follicle or cyst in the right ovary. Electronically Signed   By: Darliss Cheney M.D.   On: 08/27/2022 22:13    Procedures Procedures    Medications Ordered in ED Medications  iohexol (OMNIPAQUE) 300 MG/ML solution 75 mL (75 mLs Intravenous Contrast Given 08/27/22 2203)  potassium chloride SA (KLOR-CON M) CR tablet 20 mEq (20 mEq Oral Given 08/27/22 2239)    ED Course/ Medical Decision Making/ A&P                             Medical Decision Making Amount and/or Complexity of Data Reviewed Labs: ordered. Radiology: ordered.  Risk Prescription drug management.   Loretta Gomez 27 y.o. presented  today for postsurgical abdominal pain. Working DDx that I considered at this time includes, but not limited to, surgical complication, mesenteric ischemia, bowel perforation, gastroenteritis, colitis, small bowel obstruction, appendicitis, cholecystitis, pancreatitis, nephrolithiasis, AAA, UTI, pyelonephritis, ruptured ectopic pregnancy, PID, ovarian torsion.  R/o DDx: mesenteric ischemia, bowel perforation, gastroenteritis, colitis, small bowel obstruction, appendicitis, cholecystitis, pancreatitis, nephrolithiasis, AAA, UTI, pyelonephritis, ruptured ectopic pregnancy, PID, ovarian torsio: These are considered less likely due to history of present illness and physical exam findings.  Review of prior external notes: 08/25/2022 ED  Unique Tests and My Interpretation:  CBC with differential: Unremarkable CMP: Hypokalemia 3.3 Lipase: Negative UA: Unremarkable Urine pregnancy: Negative FOBT: Negative CT Abd/Pelvis with contrast: Small free fluid and air from recent surgery, collapsing follicle in the right ovary  Discussion with Independent Historian:  Mother  Discussion of Management of Tests: None  Risk: Medium: prescription drug management  Risk Stratification Score: None  Plan: Patient presented for abdominal pain. On exam patient was in  no acute distress and stable vitals.  Patient on exam did have laparoscopic surgical sites from her surgery 2 days ago but no signs of infection.  Patient had generalized abdominal tenderness but no peritoneal signs.  Patient noted having black stool and with a chaperone in the room a rectal exam was conducted which was ultimately negative.  Patient's labs are reassuring along with a CT scan.  CT shows normal postsurgical changes along with collapsing follicle in her right ovary which could be contributing to her pain.  The rest the patient's labs and imaging were reassuring and I encouraged patient to follow-up with her surgeon to be reevaluated as a  suspect her pain is normal postsurgical pain.  Patient stated that she does not need Zofran at home and will be discharged.  Patient was given return precautions. Patient stable for discharge at this time.  Patient verbalized understanding of plan.   Final Clinical Impression(s) / ED Diagnoses Final diagnoses:  Post-op pain    Rx / DC Orders ED Discharge Orders     None          Remi Deter 08/27/22 2330    Virgina Norfolk, DO 08/28/22 0106

## 2022-08-27 NOTE — Discharge Instructions (Signed)
Please follow-up with your surgeon regarding recent symptoms and ER visit.  Today your labs and imaging were reassuring and your pain is most likely secondary to your recent surgery.  If symptoms change or worsen please return to ER.

## 2022-08-27 NOTE — Telephone Encounter (Signed)
     Chief Complaint: Pt. Had loose, black stool today. Had surgery 08/25/22. Symptoms: Above Frequency: Today Pertinent Negatives: Patient denies  Disposition: [] ED /[] Urgent Care (no appt availability in office) / [] Appointment(In office/virtual)/ []  [facility]/ [] Home Care/ [] Refused Recommended Disposition /[] [facility]/ [x]  Follow-up with PCP Additional Notes: Pt. Will call her surgeon and her PCP with symptom today.  Reason for Disposition  [1] Abnormal color is unexplained AND [2] persists > 24 hours  Answer Assessment - Initial Assessment Questions 1. COLOR: "What color is it?" "Is that color in part or all of the stool?"     Black 2. ONSET: "When was the unusual color first noted?"     Today 3. CAUSE: "Have you eaten any food or taken any medicine of this color?" Note: See listing in Background Information section.      No 4. OTHER SYMPTOMS: "Do you have any other symptoms?" (e.g., abdomen pain, diarrhea, jaundice, fever).     Loose stool  Protocols used: Stools - Unusual Color-A-AH

## 2022-08-27 NOTE — Telephone Encounter (Signed)
Pt had appendix out Wednesday and she was able to pass a bowel movement but it was black diarrhea. Pt was advised to call her PCP. PCP is out of the office and no openings in our office today. Pt transferred to Triage

## 2022-08-27 NOTE — ED Notes (Signed)
Pt ambulated to BR, was able to urinate but "forgot" to provide a urine sample, PA-C Schuman, Fayrene Fearing T aware and is at the bedside. Advised will try again at another time, pt will let staff know when she needs to use BR again so a specimen cup may be provided.

## 2022-08-27 NOTE — ED Triage Notes (Signed)
Recent appendectomy on Wednesday. First BM post-op this morning and reports stool was "dark. Also c/o chills and confusion.

## 2022-08-27 NOTE — Telephone Encounter (Signed)
Initial Comment Caller states she has surgery on Wednesday for appendix removal. She had a bowel movement this morning that was dark and loose. She also has a crampy feeling in her abdomen. Translation No Nurse Assessment Nurse: Clarita Leber, RN, Deborah Date/Time (Eastern Time): 08/27/2022 11:59:25 AM Confirm and document reason for call. If symptomatic, describe symptoms. ---The caller states that she had laparoscopic cholecystectomy on Wednesday. Had loose stool this morning that was black and feels rectal pressure. Pain in the lower right abdomen. Does the patient have any new or worsening symptoms? ---Yes Will a triage be completed? ---Yes Related visit to physician within the last 2 weeks? ---Yes Does the PT have any chronic conditions? (i.e. diabetes, asthma, this includes High risk factors for pregnancy, etc.) ---No Is the patient pregnant or possibly pregnant? (Ask all females between the ages of 39-55) ---No Is this a behavioral health or substance abuse call? ---No Guidelines Guideline Title Affirmed Question Affirmed Notes Nurse Date/Time (Eastern Time) Rectal Bleeding Black or tarry bowel movements (Exception: Chronicunchanged black-grey BMs AND is taking Womble, RN, Gavin Pound 08/27/2022 12:04:23 PM PLEASE NOTE: All timestamps contained within this report are represented as [place] Standard Time. CONFIDENTIALTY NOTICE: This fax transmission is intended only for the addressee. It contains information that is legally privileged, confidential or otherwise protected from use or disclosure. If you are not the intended recipient, you are strictly prohibited from reviewing, disclosing, copying using or disseminating any of this information or taking any action in reliance on or regarding this information. If you have received this fax in error, please notify us immediately by telephone so that we can arrange for its return to [place]. Phone: [phone], Toll-Free: [phone], Fax:  [phone] Page: 2 of 2 Call Id: [id number] Guidelines Guideline Title Affirmed Question Affirmed Notes Nurse Date/Time Lamount Cohen Time) iron pills or PeptoBismol.) Disp. Time Lamount Cohen Time) Disposition Final User 08/27/2022 12:05:55 PM Go to ED Now Yes Clarita Leber, RN, Gavin Pound Final Disposition 08/27/2022 12:05:55 PM Go to ED Now Yes Clarita Leber, RN, Jetty Duhamel Disagree/Comply Comply Caller Understands Yes PreDisposition Call Doctor Care Advice Given Per Guideline GO TO ED NOW: Comments User: Alita Chyle, RN Date/Time Lamount Cohen Time): 08/27/2022 12:07:22 PM The caller was advised to call her surgeon prior to going to the ED and follow his/her directions and this nurse did ask her about having the surgeon's phone number. She denies taking any iron supplements or Pepto Bismol. She verbalized understanding. Referrals GO TO FACILITY UNDECIDED

## 2022-08-30 NOTE — Telephone Encounter (Signed)
Pt went to ED

## 2022-11-02 ENCOUNTER — Emergency Department (HOSPITAL_BASED_OUTPATIENT_CLINIC_OR_DEPARTMENT_OTHER)
Admission: EM | Admit: 2022-11-02 | Discharge: 2022-11-02 | Disposition: A | Discharge status: Home / Self Care | Payer: Self-pay

## 2022-11-02 ENCOUNTER — Other Ambulatory Visit: Payer: Self-pay

## 2022-11-02 ENCOUNTER — Encounter (HOSPITAL_BASED_OUTPATIENT_CLINIC_OR_DEPARTMENT_OTHER): Payer: Self-pay | Admitting: Urology

## 2022-11-02 ENCOUNTER — Telehealth: Payer: Self-pay

## 2022-11-02 ENCOUNTER — Emergency Department (HOSPITAL_BASED_OUTPATIENT_CLINIC_OR_DEPARTMENT_OTHER): Payer: Self-pay

## 2022-11-02 DIAGNOSIS — R002 Palpitations: Secondary | ICD-10-CM | POA: Insufficient documentation

## 2022-11-02 DIAGNOSIS — M25511 Pain in right shoulder: Secondary | ICD-10-CM | POA: Insufficient documentation

## 2022-11-02 DIAGNOSIS — R110 Nausea: Secondary | ICD-10-CM | POA: Insufficient documentation

## 2022-11-02 LAB — BASIC METABOLIC PANEL
Anion gap: 7 (ref 5–15)
BUN: 13 mg/dL (ref 6–20)
CO2: 27 mmol/L (ref 22–32)
Calcium: 9.2 mg/dL (ref 8.9–10.3)
Chloride: 103 mmol/L (ref 98–111)
Creatinine, Ser: 0.49 mg/dL (ref 0.44–1.00)
GFR, Estimated: >60 mL/min (ref >60)
Glucose, Bld: 100 mg/dL — ABNORMAL HIGH (ref 70–99)
Potassium: 3.7 mmol/L (ref 3.5–5.1)
Sodium: 137 mmol/L (ref 135–145)

## 2022-11-02 LAB — CBC
HCT: 41.6 % (ref 36.0–46.0)
Hemoglobin: 13.8 g/dL (ref 12.0–15.0)
MCH: 29.5 pg (ref 26.0–34.0)
MCHC: 33.2 g/dL (ref 30.0–36.0)
MCV: 88.9 fL (ref 80.0–100.0)
Platelets: 202 10*3/uL (ref 150–400)
RBC: 4.68 MIL/uL (ref 3.87–5.11)
RDW: 12.8 % (ref 11.5–15.5)
WBC: 7.6 10*3/uL (ref 4.0–10.5)
nRBC: 0 % (ref 0.0–0.2)

## 2022-11-02 LAB — PREGNANCY, URINE: Preg Test, Ur: NEGATIVE

## 2022-11-02 LAB — TROPONIN I (HIGH SENSITIVITY): Troponin I (High Sensitivity): 4 ng/L (ref <18)

## 2022-11-02 NOTE — Telephone Encounter (Signed)
Pt triaged to ED

## 2022-11-02 NOTE — Telephone Encounter (Signed)
Pt in ED.  

## 2022-11-02 NOTE — ED Triage Notes (Signed)
Pt states left arm pain that started in June and states felt faint this am and states feels like heart is jumping  States been feeling off balance  States pain comes and goes  States feels like symptoms worsen when standing from sitting

## 2022-11-02 NOTE — ED Notes (Signed)
Reviewed discharge instructions with pt and states understanding

## 2022-11-02 NOTE — ED Provider Notes (Signed)
[facility] [facility] Provider Note   CSN: [id number] Arrival date & time: 11/02/22  1116     History  Chief Complaint  Patient presents with   Palpitations    Loretta Gomez is a 27 y.o. female.  Patient with no significant medical history, uncomplicated appendectomy 08/25/22, presents with intermittent symptoms of palpations described as "skipping beats", aching in her right shoulder and nausea. She reports symptoms started just after her surgery 2 months ago. No fever, cough, congestion. No chest pain or abdominal pain. She is a nonsmoker. She had symptoms this morning and contacted her PCP who advised ED evaluation. Symptoms have since completely resolved. The last episode was 3-4 days ago.   The history is provided by the patient. No language interpreter was used.  Palpitations      Home Medications Prior to Admission medications   Not on File      Allergies    Diflucan [fluconazole] and Lobster [shellfish allergy]    Review of Systems   Review of Systems  Cardiovascular:  Positive for palpitations.    Physical Exam Updated Vital Signs BP (!) 103/54   Pulse 92   Temp 98.1 F (36.7 C)   Resp 16   Ht 5\' 3"  (1.6 m)   Wt 40.4 kg   LMP 10/26/2022   SpO2 100%   BMI 15.78 kg/m  Physical Exam Vitals and nursing note reviewed.  Constitutional:      General: She is not in acute distress.    Appearance: She is not ill-appearing or diaphoretic.  Eyes:     Conjunctiva/sclera: Conjunctivae normal.  Neck:     Vascular: No carotid bruit.  Cardiovascular:     Rate and Rhythm: Normal rate and regular rhythm.  Pulmonary:     Effort: Pulmonary effort is normal.     Breath sounds: Normal breath sounds.  Abdominal:     General: There is no distension.     Palpations: Abdomen is soft.  Musculoskeletal:        General: Normal range of motion.     Cervical back: Normal range of motion and neck supple.  Skin:    General:  Skin is warm and dry.  Neurological:     General: No focal deficit present.     Mental Status: She is alert and oriented to person, place, and time.     ED Results / Procedures / Treatments   Labs (all labs ordered are listed, but only abnormal results are displayed) Labs Reviewed  BASIC METABOLIC PANEL - Abnormal; Notable for the following components:      Result Value   Glucose, Bld 100 (*)    All other components within normal limits  CBC  PREGNANCY, URINE  TROPONIN I (HIGH SENSITIVITY)    EKG EKG Interpretation Date/Time:  Tuesday November 02 2022 11:27:16 EDT Ventricular Rate:  86 PR Interval:  123 QRS Duration:  76 QT Interval:  336 QTC Calculation: 402 R Axis:   69  Text Interpretation: Sinus rhythm Right atrial enlargement Confirmed by Estanislado Pandy [phone]) on 11/02/2022 12:18:17 PM  Radiology No results found.  Procedures Procedures    Medications Ordered in ED Medications - No data to display  ED Course/ Medical Decision Making/ A&P                                 Medical Decision Making Symptoms described in HPI ongoing  for 2 months intermittently, last episode today which have now resolved. Labs reviewed - normal hemoglobin, no leukocytosis, not pregnant. ECG NSR. CXR negative. Outside window of suspicion for PE, normal HR, no tachycardia. No fever or sign of infection.   Amount and/or Complexity of Data Reviewed Independent Historian: parent Labs: ordered. Radiology: ordered and independent interpretation performed.    Details: No infiltrates, PTX ECG/medicine tests: independent interpretation performed.    Details: No arrhythmia Discussion of management or test interpretation with external provider(s): Discussed normal work up and need for follow up with PCP for any further testing as indicated.            Final Clinical Impression(s) / ED Diagnoses Final diagnoses:  None    Rx / DC Orders ED Discharge Orders     None          Elpidio Anis, PA-C 11/02/22 1243    Coral Spikes, DO 11/02/22 1527

## 2022-11-02 NOTE — Discharge Instructions (Signed)
Please follow up with your primary care doctor as discussed for any further outpatient testing felt necessary. Return to the ED as needed for new or worsening symptoms at any time.

## 2022-11-02 NOTE — ED Notes (Signed)
Patient transported to X-ray 

## 2022-11-02 NOTE — Telephone Encounter (Signed)
Initial Comment Caller states she has her appendix removed a couple of weeks ago. Caller feels a shooting pain in her arm, vomiting, b/p dropped and felt like she was going to faint. Caller states she feels like her weight comes up and down and feels off balance. no blood and urinated in the last 8 hrs. Translation No Nurse Assessment Nurse: Henri Medal, RN, Amy Date/Time (Eastern Time): 11/02/2022 10:15:35 AM Confirm and document reason for call. If symptomatic, describe symptoms. ---Caller states she had an appendectomy 2 weeks ago. She is having a shooting pain in her left arm, vomiting, & dizziness. She feels off balance. She feels weak. Since the surgery, she can feel her heart pounding hard & will skip a beat. It is happening frequently, almost every day. Pain in arm comes & goes. Her arm can be still. Not the whole arm, but above the elbow. Does the patient have any new or worsening symptoms? ---Yes Will a triage be completed? ---Yes Related visit to physician within the last 2 weeks? ---No Does the PT have any chronic conditions? (i.e. diabetes, asthma, this includes High risk factors for pregnancy, etc.) ---No Is the patient pregnant or possibly pregnant? (Ask all females between the ages of 22-55) ---No Is this a behavioral health or substance abuse call? ---No Guidelines Guideline Title Affirmed Question Affirmed Notes Nurse Date/Time (Eastern Time) Dizziness - Lightheadedness Extra heartbeats, irregular heart beating, or heart is Lovelace, Charity fundraiser, Amy 11/02/2022 10:19:53 AM PLEASE NOTE: All timestamps contained within this report are represented as [place] Standard Time. CONFIDENTIALTY NOTICE: This fax transmission is intended only for the addressee. It contains information that is legally privileged, confidential or otherwise protected from use or disclosure. If you are not the intended recipient, you are strictly prohibited from reviewing, disclosing, copying using or  disseminating any of this information or taking any action in reliance on or regarding this information. If you have received this fax in error, please notify us immediately by telephone so that we can arrange for its return to [place]. Phone: [phone], Toll-Free: [phone], Fax: [phone] Page: 2 of 2 Call Id: [id number] Guidelines Guideline Title Affirmed Question Affirmed Notes Nurse Date/Time Lamount Cohen Time) beating very fast (i.e., "palpitations") Disp. Time Lamount Cohen Time) Disposition Final User 11/02/2022 10:37:57 AM Go to ED Now (or PCP triage) Yes Lovelace, RN, Amy Final Disposition 11/02/2022 10:37:57 AM Go to ED Now (or PCP triage) Yes Lovelace, RN, Amy Caller Disagree/Comply Comply Caller Understands Yes PreDisposition InappropriateToAsk Care Advice Given Per Guideline GO TO ED NOW (OR PCP TRIAGE): * IF NO PCP (PRIMARY CARE PROVIDER) SECOND-LEVEL TRIAGE: You need to be seen within the next hour. Go to the ED/UCC at _____________ Hospital. Leave as soon as you can. ANOTHER ADULT SHOULD DRIVE: * It is better and safer if another adult drives instead of you. BRING MEDICINES: * Bring a list of your current medicines when you go to the Emergency Department (ER). CARE ADVICE given per Dizziness (Adult) guideline. Referrals [facility] - ED

## 2022-11-08 ENCOUNTER — Ambulatory Visit: Payer: Self-pay | Admitting: Family

## 2023-01-07 ENCOUNTER — Telehealth (INDEPENDENT_AMBULATORY_CARE_PROVIDER_SITE_OTHER): Payer: Self-pay | Admitting: Family

## 2023-01-07 ENCOUNTER — Encounter: Payer: Self-pay | Admitting: Family

## 2023-01-07 VITALS — Wt 89.7 lb

## 2023-01-07 DIAGNOSIS — F418 Other specified anxiety disorders: Secondary | ICD-10-CM

## 2023-01-07 DIAGNOSIS — F4522 Body dysmorphic disorder: Secondary | ICD-10-CM | POA: Insufficient documentation

## 2023-01-07 NOTE — Assessment & Plan Note (Addendum)
She reports improved self body image. She is eating every 2 hours.  Her weight is stable at 89 pounds. She states she lost a few pounds over the summer due to appendectomy. Monitor. Wt Readings from Last 3 Encounters:  01/07/23 89 lb 11.2 oz (40.7 kg)  11/02/22 89 lb 1.6 oz (40.4 kg)  08/27/22 90 lb (40.8 kg)

## 2023-01-07 NOTE — Patient Instructions (Signed)
VISIT SUMMARY:  You came in today for a medical clearance for [employer] service. You reported significant improvement in your mental health since your last visit in 2023. You have been eating every two hours to gain weight and feel happier with better control over your symptoms. You are not currently using any psychiatric medications or attending counseling, and you have not experienced any recent panic attacks. You also mentioned that you had an appendectomy in June, which led to some weight loss, but you have otherwise recovered well. You are not using marijuana, CBD, tobacco, or vaping, and you have no current allergies to shellfish.  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 Your symptoms have improved with regular eating every two hours to gain weight. Continue with your current self-management strategies.  -DEPRESSION AND ANXIETY: Depression and anxiety are mental health conditions that affect your mood and feelings. Your symptoms have improved, you are not experiencing panic attacks, and you are sleeping well. Continue with your current self-management strategies.  -OBSESSIVE-COMPULSIVE DISORDER (OCD): OCD is a mental health condition where a person has obsessive thoughts and compulsive behaviors. Your symptoms have improved, and you are not currently receiving treatment. Continue with your current self-management strategies.  -BIPOLAR DISORDER: Bipolar disorder is a mental health condition that causes extreme mood swings. You do not have a formal diagnosis and are not showing symptoms. Continue with your current self-management strategies.  -APPENDECTOMY: An appendectomy is a surgical procedure to remove the appendix. You had this surgery in June 2024 and experienced some weight loss but have recovered well. No specific plan is needed.  -GENERAL HEALTH MAINTENANCE: Consider receiving  a flu shot and the updated COVID vaccine for the current season, pending approval from [employer], [employer].  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 Continue with your current self-management strategies for your mental health conditions.

## 2023-01-07 NOTE — Progress Notes (Signed)
MyChart Video Visit    Virtual Visit via Video Note    Patient location: Home. Patient and provider in visit Provider location: Office  I discussed the limitations of evaluation and management by telemedicine and the availability of in person appointments. The patient expressed understanding and agreed to proceed.  Visit Date: 01/07/2023  Today's healthcare provider: Lemont Fillers, NP     Subjective:    Patient ID: Loretta Gomez, female    DOB: September 14, 1995, 27 y.o.   MRN: [id number]  Chief Complaint  Patient presents with   Anxiety    Follow up      The patient, with a history of body dysmorphia, depression, anxiety, and OCD, presents for a medical clearance for [employer] service. She reports significant improvement in her mental health since her last visit in 2023. She has been eating every two hours to gain weight and reports feeling happier with improved control over her mental health symptoms. She denies current use of any psychiatric medications and has not been in counseling recently. She also denies any recent panic attacks. She denies any symptoms of manic behavior. Notes that she once spoke to a therapit about concern that she might have bipolar disorder but this diagnosis was not formally made. She underwent an appendectomy in June, which resulted in some weight loss, but has otherwise recovered well. She denies any current use of marijuana, CBD, or tobacco, and has not vaped in years. She also denies any current allergies to shellfish.  Past Medical History:  Diagnosis Date   History of chicken pox     Past Surgical History:  Procedure Laterality Date   APPENDECTOMY  08/25/2022   HERNIA REPAIR N/A 2010   right inguinal    LAPAROSCOPY N/A 08/25/2022   Procedure: LAPAROSCOPY APPENDECTOMY;  Surgeon: Darnell Level, MD;  Location: [facility];  Service: General;  Laterality: N/A;    Family History  Problem Relation Age of Onset   Mental illness  Maternal Grandmother    Cancer Maternal Grandfather    Heart disease Maternal Grandfather    Hypertension Maternal Aunt    Diabetes Paternal Uncle     Social History   Socioeconomic History   Marital status: Single    Spouse name: Not on file   Number of children: Not on file   Years of education: Not on file   Highest education level: 12th grade  Occupational History   Not on file  Tobacco Use   Smoking status: Never   Smokeless tobacco: Never   Tobacco comments:    once a month  Vaping Use   Vaping status: Former   Substances: CBD, Flavoring   Devices: no current vaping  Substance and Sexual Activity   Alcohol use: Not Currently   Drug use: Not Currently    Types: Marijuana    Comment: Has not used in years   Sexual activity: Not Currently    Birth control/protection: None  Other Topics Concern   Not on file  Social History Narrative   Lives with her mom   Consulting civil engineer- online classes, [employer] in [employer]   No pets   Enjoys drawing, spending time with friends   Social Determinants of Health   Financial Resource Strain: Low Risk  (11/03/2022)   Overall Financial Resource Strain (CARDIA)    Difficulty of Paying Living Expenses: Not hard at all  Food Insecurity: No Food Insecurity (11/03/2022)   Hunger Vital Sign    Worried About Running Out  of Food in the Last Year: Never true    Ran Out of Food in the Last Year: Never true  Transportation Needs: No Transportation Needs (11/03/2022)   PRAPARE - Administrator, Civil Service (Medical): No    Lack of Transportation (Non-Medical): No  Physical Activity: Insufficiently Active (11/03/2022)   Exercise Vital Sign    Days of Exercise per Week: 4 days    Minutes of Exercise per Session: 30 min  Stress: Patient Declined (11/03/2022)   [employer] of Occupational Health - Occupational Stress Questionnaire    Feeling of Stress : Patient declined  Social Connections: Socially Isolated (11/03/2022)    Social Connection and Isolation Panel [NHANES]    Frequency of Communication with Friends and Family: Three times a week    Frequency of Social Gatherings with Friends and Family: Once a week    Attends Religious Services: Never    Database administrator or Organizations: No    Attends Engineer, structural: Not on file    Marital Status: Never married  Intimate Partner Violence: Not on file    No outpatient medications prior to visit.   No facility-administered medications prior to visit.    Allergies  Allergen Reactions   Diflucan [Fluconazole] Shortness Of Breath and Other (See Comments)    Stomach pain   Lobster [Shellfish Allergy] Itching and Other (See Comments)    Lobster only per pt. Itching in and around mouth.   Pt states that this was only as a child- states she is now able to eat shellfish without reaction    ROS See HPI    Objective:    Physical Exam  Wt 89 lb 11.2 oz (40.7 kg)   BMI 15.89 kg/m  Wt Readings from Last 3 Encounters:  01/07/23 89 lb 11.2 oz (40.7 kg)  11/02/22 89 lb 1.6 oz (40.4 kg)  08/27/22 90 lb (40.8 kg)    Gen: Awake, alert, no acute distress Resp: Breathing is even and non-labored Psych: calm/pleasant demeanor Neuro: Alert and Oriented x 3, + facial symmetry, speech is clear.     Assessment & Plan:   Problem List Items Addressed This Visit       Unprioritized   Depression with anxiety - Primary    Currently stable without therapy or medication.  Monitor.   Has never had formal diagnosis of BPD- no clinical signs of bipolar currently.  OCD- reports that symptoms are much improved.        Body dysmorphic disorder    She reports improved self body image. She is eating every 2 hours.  Her weight is stable at 89 pounds. She states she lost a few pounds over the summer due to appendectomy. Monitor. Wt Readings from Last 3 Encounters:  11/02/22 89 lb 1.6 oz (40.4 kg)  08/27/22 90 lb (40.8 kg)  08/25/22 90 lb (40.8 kg)           Lulia Orpinel-Cereceres does not currently have medications on file.  No orders of the defined types were placed in this encounter.  20 minutes spent on today's visit. Time was spent interviewing patient, reviewing medical record and writing requested letter for the [employer].  I discussed the assessment and treatment plan with the patient. The patient was provided an opportunity to ask questions and all were answered. The patient agreed with the plan and demonstrated an understanding of the instructions.   The patient was advised to call back or seek an in-person evaluation  if the symptoms worsen or if the condition fails to improve as anticipated.  Lemont Fillers, NP [facility] [phone] (phone) [phone] (fax)  [facility]

## 2023-01-07 NOTE — Assessment & Plan Note (Addendum)
Currently stable without therapy or medication.  Monitor.   Has never had formal diagnosis of BPD- no clinical signs of bipolar currently.  OCD- reports that symptoms are much improved.

## 2023-01-07 NOTE — Progress Notes (Signed)
MyChart Video Visit    Virtual Visit via Video Note    Patient location: Home. Patient and provider in visit Provider location: Office  I discussed the limitations of evaluation and management by telemedicine and the availability of in person appointments. The patient expressed understanding and agreed to proceed.  Visit Date: 01/07/2023  Today's healthcare provider: Lemont Fillers, NP     Subjective:    Patient ID: Loretta Gomez, female    DOB: Jul 16, 1995, 27 y.o.   MRN: [id number]  Chief Complaint  Patient presents with   Anxiety    Follow up    HPI  The patient, with a history of body dysmorphia, depression, anxiety, and OCD, presents for a medical clearance for [employer] service. She reports significant improvement in her mental health since her last visit in 2023. She has been eating every two hours to gain weight and reports feeling happier with improved control over her mental health symptoms. She denies current use of any psychiatric medications and has not been in counseling recently. She also denies any recent panic attacks. She denies any symptoms of manic behavior. Notes that she once spoke to a therapit about concern that she might have bipolar disorder but this diagnosis was not formally made. She underwent an appendectomy in June, which resulted in some weight loss, but has otherwise recovered well. She denies any current use of marijuana, CBD, or tobacco, and has not vaped in years. She also denies any current allergies to shellfish.  Past Medical History:  Diagnosis Date   History of chicken pox     Past Surgical History:  Procedure Laterality Date   APPENDECTOMY  08/25/2022   HERNIA REPAIR N/A 2010   right inguinal    LAPAROSCOPY N/A 08/25/2022   Procedure: LAPAROSCOPY APPENDECTOMY;  Surgeon: Darnell Level, MD;  Location: [facility];  Service: General;  Laterality: N/A;    Family History  Problem Relation Age of Onset   Mental illness  Maternal Grandmother    Cancer Maternal Grandfather    Heart disease Maternal Grandfather    Hypertension Maternal Aunt    Diabetes Paternal Uncle     Social History   Socioeconomic History   Marital status: Single    Spouse name: Not on file   Number of children: Not on file   Years of education: Not on file   Highest education level: 12th grade  Occupational History   Not on file  Tobacco Use   Smoking status: Never   Smokeless tobacco: Never   Tobacco comments:    once a month  Vaping Use   Vaping status: Former   Substances: CBD, Flavoring   Devices: no current vaping  Substance and Sexual Activity   Alcohol use: Not Currently   Drug use: Not Currently    Types: Marijuana    Comment: Has not used in years   Sexual activity: Not Currently    Birth control/protection: None  Other Topics Concern   Not on file  Social History Narrative   Lives with her mom   Consulting civil engineer- online classes, [employer] in [employer]   No pets   Enjoys drawing, spending time with friends   Social Determinants of Health   Financial Resource Strain: Low Risk  (11/03/2022)   Overall Financial Resource Strain (CARDIA)    Difficulty of Paying Living Expenses: Not hard at all  Food Insecurity: No Food Insecurity (11/03/2022)   Hunger Vital Sign    Worried About Running Out  of Food in the Last Year: Never true    Ran Out of Food in the Last Year: Never true  Transportation Needs: No Transportation Needs (11/03/2022)   PRAPARE - Administrator, Civil Service (Medical): No    Lack of Transportation (Non-Medical): No  Physical Activity: Insufficiently Active (11/03/2022)   Exercise Vital Sign    Days of Exercise per Week: 4 days    Minutes of Exercise per Session: 30 min  Stress: Patient Declined (11/03/2022)   [employer] of Occupational Health - Occupational Stress Questionnaire    Feeling of Stress : Patient declined  Social Connections: Socially Isolated (11/03/2022)    Social Connection and Isolation Panel [NHANES]    Frequency of Communication with Friends and Family: Three times a week    Frequency of Social Gatherings with Friends and Family: Once a week    Attends Religious Services: Never    Database administrator or Organizations: No    Attends Engineer, structural: Not on file    Marital Status: Never married  Intimate Partner Violence: Not on file    No outpatient medications prior to visit.   No facility-administered medications prior to visit.    Allergies  Allergen Reactions   Diflucan [Fluconazole] Shortness Of Breath and Other (See Comments)    Stomach pain   Lobster [Shellfish Allergy] Itching and Other (See Comments)    Lobster only per pt. Itching in and around mouth.   Pt states that this was only as a child- states she is now able to eat shellfish without reaction    ROS See HPI    Objective:    Physical Exam  Wt 89 lb 11.2 oz (40.7 kg)   BMI 15.89 kg/m  Wt Readings from Last 3 Encounters:  01/07/23 89 lb 11.2 oz (40.7 kg)  11/02/22 89 lb 1.6 oz (40.4 kg)  08/27/22 90 lb (40.8 kg)   Gen: Awake, alert, no acute distress Resp: Breathing is even and non-labored Psych: calm/pleasant demeanor Neuro: Alert and Oriented x 3, + facial symmetry, speech is clear.      Assessment & Plan:   Problem List Items Addressed This Visit       Unprioritized   Depression with anxiety - Primary    Currently stable without therapy or medication.  Monitor.   Has never had formal diagnosis of BPD- no clinical signs of bipolar currently.  OCD- reports that symptoms are much improved.        Body dysmorphic disorder    She reports improved self body image. She is eating every 2 hours.  Her weight is stable at 89 pounds. She states she lost a few pounds over the summer due to appendectomy. Monitor. Wt Readings from Last 3 Encounters:  01/07/23 89 lb 11.2 oz (40.7 kg)  11/02/22 89 lb 1.6 oz (40.4 kg)  08/27/22 90 lb  (40.8 kg)          [facility] does not currently have medications on file.  No orders of the defined types were placed in this encounter.   I discussed the assessment and treatment plan with the patient. The patient was provided an opportunity to ask questions and all were answered. The patient agreed with the plan and demonstrated an understanding of the instructions.   The patient was advised to call back or seek an in-person evaluation if the symptoms worsen or if the condition fails to improve as anticipated.   Lemont Fillers, NP  [facility] at [facility] [phone] (phone) [phone] (fax)  [facility]

## 2023-02-01 ENCOUNTER — Other Ambulatory Visit (HOSPITAL_BASED_OUTPATIENT_CLINIC_OR_DEPARTMENT_OTHER): Payer: Self-pay

## 2023-02-07 ENCOUNTER — Ambulatory Visit (INDEPENDENT_AMBULATORY_CARE_PROVIDER_SITE_OTHER): Payer: Self-pay | Admitting: Family

## 2023-02-07 ENCOUNTER — Telehealth: Payer: Self-pay | Admitting: Family

## 2023-02-07 VITALS — BP 102/58 | HR 75 | Temp 99.4°F | Resp 16 | Ht 63.0 in | Wt 91.0 lb

## 2023-02-07 DIAGNOSIS — N6321 Unspecified lump in the left breast, upper outer quadrant: Secondary | ICD-10-CM

## 2023-02-07 DIAGNOSIS — Z0000 Encounter for general adult medical examination without abnormal findings: Secondary | ICD-10-CM

## 2023-02-07 DIAGNOSIS — F4522 Body dysmorphic disorder: Secondary | ICD-10-CM

## 2023-02-07 DIAGNOSIS — Z23 Encounter for immunization: Secondary | ICD-10-CM

## 2023-02-07 DIAGNOSIS — F418 Other specified anxiety disorders: Secondary | ICD-10-CM

## 2023-02-07 NOTE — Patient Instructions (Signed)
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 You reported feeling great with no issues related to mood, anxiety, or depression. You also mentioned that you have good sleep and appetite, and you have gained two pounds since your last visit. You have no complaints of palpitations, chest pain, or shortness of breath.  YOUR PLAN:  -MILITARY RECRUITMENT: You need an EKG and a behavioral health evaluation for your military recruitment. An EKG will be performed today. Please confirm whether you need to see a counselor or psychiatrist for the behavioral health evaluation.  -IMMUNIZATIONS: You are due for a tetanus shot and have not received a flu shot this season. Both vaccines will be administered today.  -GENERAL HEALTH: You reported no palpitations, chest pain, or shortness of breath, and you have a good appetite with a slight weight gain. Continue with your current health maintenance practices.  INSTRUCTIONS:  Please follow up after you receive information about the required behavioral health evaluation for your military recruitment.

## 2023-02-07 NOTE — Assessment & Plan Note (Signed)
Clinically stable, weight up 2 pounds.  Continue to monitor.

## 2023-02-07 NOTE — Telephone Encounter (Signed)
See phone note

## 2023-02-07 NOTE — Progress Notes (Signed)
Subjective:     Patient ID: Loretta Gomez, female    DOB: 06-Dec-1995, 27 y.o.   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  History of Present Illness   Loretta Gomez, presents for an EKG and behavioral health evaluation required for military recruitment. She reports feeling great with no current issues related to mood, anxiety, or depression. She denies any current use of medications for these conditions and denies any recent panic attacks. She reports good sleep and appetite. She has gained two pounds since her last visit. She has no complaints of palpitations, chest pain, or shortness of breath.          Health Maintenance Due  Topic Date Due   COVID-19 Vaccine (1) Never done   Hepatitis C Screening  Never done   Cervical Cancer Screening (Pap smear)  01/01/2022    Past Medical History:  Diagnosis Date   History of chicken pox     Past Surgical History:  Procedure Laterality Date   APPENDECTOMY  08/25/2022   HERNIA REPAIR N/A 2010   right inguinal    LAPAROSCOPY N/A 08/25/2022   Procedure: LAPAROSCOPY APPENDECTOMY;  Surgeon: Darnell Level, MD;  Location: [facility];  Service: General;  Laterality: N/A;    Family History  Problem Relation Age of Onset   Mental illness Maternal Grandmother    Cancer Maternal Grandfather    Heart disease Maternal Grandfather    Hypertension Maternal Aunt    Diabetes Paternal Uncle     Social History   Socioeconomic History   Marital status: Single    Spouse name: Not on file   Number of children: Not on file   Years of education: Not on file   Highest education level: Some college, no degree  Occupational History   Not on file  Tobacco Use   Smoking status: Never   Smokeless tobacco: Never   Tobacco comments:    once  a month  Vaping Use   Vaping status: Former   Substances: CBD, Flavoring   Devices: no current vaping  Substance and Sexual Activity   Alcohol use: Not Currently   Drug use: Not Currently    Types: Marijuana    Comment: Has not used in years   Sexual activity: Not Currently    Birth control/protection: None  Other Topics Concern   Not on file  Social History Narrative   Lives with her mom   Consulting civil engineer- online classes, [employer] in [employer]   No pets   Enjoys drawing, spending time with friends   Social Determinants of Health   Financial Resource Strain: Low Risk  (02/03/2023)   Overall Financial Resource Strain (CARDIA)    Difficulty of Paying Living Expenses: Not hard at all  Food Insecurity: No Food Insecurity (02/03/2023)   Hunger Vital Sign    Worried About Running Out of Food in the Last Year: Never true    Ran Out of Food in the Last Year: Never true  Transportation Needs: No Transportation Needs (02/03/2023)   PRAPARE - Administrator, Civil Service (Medical): No    Lack of Transportation (Non-Medical): No  Physical Activity: Insufficiently Active (02/03/2023)   Exercise Vital Sign    Days of Exercise per Week: 4 days  Minutes of Exercise per Session: 30 min  Stress: No Stress Concern Present (02/03/2023)   [employer] of Occupational Health - Occupational Stress Questionnaire    Feeling of Stress : Not at all  Social Connections: Socially Isolated (02/03/2023)   Social Connection and Isolation Panel [NHANES]    Frequency of Communication with Friends and Family: More than three times a week    Frequency of Social Gatherings with Friends and Family: Once a week    Attends Religious Services: Never    Database administrator or Organizations: No    Attends Engineer, structural: Not on file    Marital Status: Never married  Intimate Partner Violence: Not on file    No outpatient medications prior to visit.   No  facility-administered medications prior to visit.    Allergies  Allergen Reactions   Diflucan [Fluconazole] Shortness Of Breath and Other (See Comments)    Stomach pain   Lobster [Shellfish Allergy] Itching and Other (See Comments)    Lobster only per pt. Itching in and around mouth.   Pt states that this was only as a child- states she is now able to eat shellfish without reaction    ROS    See HPI Objective:    Physical Exam Constitutional:      General: She is not in acute distress.    Appearance: Normal appearance. She is well-developed.  HENT:     Head: Normocephalic and atraumatic.     Right Ear: External ear normal.     Left Ear: External ear normal.  Eyes:     General: No scleral icterus. Neck:     Thyroid: No thyromegaly.  Cardiovascular:     Rate and Rhythm: Normal rate and regular rhythm.     Heart sounds: Normal heart sounds. No murmur heard. Pulmonary:     Effort: Pulmonary effort is normal. No respiratory distress.     Breath sounds: Normal breath sounds. No wheezing.  Musculoskeletal:     Cervical back: Neck supple.  Skin:    General: Skin is warm and dry.  Neurological:     Mental Status: She is alert and oriented to person, place, and time.  Psychiatric:        Mood and Affect: Mood normal.        Behavior: Behavior normal.        Thought Content: Thought content normal.        Judgment: Judgment normal.      BP (!) 102/58 (BP Location: Right Arm, Patient Position: Sitting, Cuff Size: Small)   Pulse 75   Temp 99.4 F (37.4 C) (Oral)   Resp 16   Ht 5\' 3"  (1.6 m)   Wt 91 lb (41.3 kg)   SpO2 100%   BMI 16.12 kg/m  Wt Readings from Last 3 Encounters:  02/07/23 91 lb (41.3 kg)  01/07/23 89 lb 11.2 oz (40.7 kg)  11/02/22 89 lb 1.6 oz (40.4 kg)       Assessment & Plan:   Problem List Items Addressed This Visit       Unprioritized   Preventative health care    EKG tracing is personally reviewed.  EKG notes NSR.  No acute changes.         Relevant Orders   EKG 12-Lead (Completed)   Depression with anxiety - Primary    Clinically stable.  She needs formal mental health evaluation for [employer].  Not clear if she needs psychiatry or psychology referral.  She will check with her military contact and let me know.  Referral pending this information.       RESOLVED: Breast lump on left side at 1 o'clock position   Body dysmorphic disorder    Clinically stable, weight up 2 pounds.  Continue to monitor.       Other Visit Diagnoses     Needs flu shot       Relevant Orders   Flu vaccine trivalent PF, 6mos and older(Flulaval,Afluria,Fluarix,Fluzone) (Completed)   Need for prophylactic vaccination with tetanus-diphtheria (Td)           [facility] does not currently have medications on file.  No orders of the defined types were placed in this encounter.

## 2023-02-07 NOTE — Assessment & Plan Note (Signed)
EKG tracing is personally reviewed.  EKG notes NSR.  No acute changes.

## 2023-02-07 NOTE — Assessment & Plan Note (Signed)
Clinically stable.  She needs formal mental health evaluation for [employer].  Not clear if she needs psychiatry or psychology referral. She will check with her military contact and let me know.  Referral pending this information.

## 2023-02-14 ENCOUNTER — Encounter: Payer: Self-pay | Admitting: Family
